# Patient Record
Sex: Female | Born: 1955 | Race: White | Hispanic: No | Marital: Married | Smoking: Former smoker
Health system: Southern US, Community
[De-identification: ages and names within clinical notes are randomized; demographics above are authoritative.]

## PROBLEM LIST (undated history)

## (undated) ENCOUNTER — Emergency Department (HOSPITAL_BASED_OUTPATIENT_CLINIC_OR_DEPARTMENT_OTHER): Admission: EM | Disposition: A | Discharge status: Other Acute Inpatient Hospital | Payer: BLUE CROSS/BLUE SHIELD

## (undated) DIAGNOSIS — Z923 Personal history of irradiation: Secondary | ICD-10-CM

## (undated) DIAGNOSIS — C801 Malignant (primary) neoplasm, unspecified: Secondary | ICD-10-CM

## (undated) DIAGNOSIS — I10 Essential (primary) hypertension: Secondary | ICD-10-CM

## (undated) DIAGNOSIS — I4891 Unspecified atrial fibrillation: Secondary | ICD-10-CM

## (undated) DIAGNOSIS — R000 Tachycardia, unspecified: Secondary | ICD-10-CM

## (undated) DIAGNOSIS — E039 Hypothyroidism, unspecified: Secondary | ICD-10-CM

## (undated) DIAGNOSIS — K635 Polyp of colon: Secondary | ICD-10-CM

## (undated) DIAGNOSIS — G43909 Migraine, unspecified, not intractable, without status migrainosus: Secondary | ICD-10-CM

## (undated) DIAGNOSIS — E079 Disorder of thyroid, unspecified: Secondary | ICD-10-CM

## (undated) DIAGNOSIS — F419 Anxiety disorder, unspecified: Secondary | ICD-10-CM

## (undated) HISTORY — DX: Anxiety disorder, unspecified: F41.9

## (undated) HISTORY — PX: BREAST BIOPSY: SHX20

## (undated) HISTORY — PX: HERNIA REPAIR: SHX51

## (undated) HISTORY — DX: Malignant (primary) neoplasm, unspecified: C80.1

## (undated) HISTORY — DX: Unspecified atrial fibrillation: I48.91

## (undated) HISTORY — DX: Disorder of thyroid, unspecified: E07.9

## (undated) HISTORY — DX: Migraine, unspecified, not intractable, without status migrainosus: G43.909

## (undated) HISTORY — PX: WISDOM TOOTH EXTRACTION: SHX21

## (undated) HISTORY — DX: Polyp of colon: K63.5

## (undated) HISTORY — DX: Tachycardia, unspecified: R00.0

---
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
## 1999-08-09 ENCOUNTER — Encounter: Admission: RE | Admit: 1999-08-09 | Discharge: 1999-08-09 | Payer: Self-pay | Admitting: Family Medicine

## 1999-08-09 ENCOUNTER — Encounter: Payer: Self-pay | Admitting: Family Medicine

## 2000-10-12 ENCOUNTER — Encounter: Payer: Self-pay | Admitting: Family Medicine

## 2000-10-12 ENCOUNTER — Encounter: Admission: RE | Admit: 2000-10-12 | Discharge: 2000-10-12 | Payer: Self-pay | Admitting: Family Medicine

## 2000-10-27 ENCOUNTER — Other Ambulatory Visit: Admission: RE | Admit: 2000-10-27 | Discharge: 2000-10-27 | Payer: Self-pay | Admitting: Obstetrics and Gynecology

## 2001-10-29 ENCOUNTER — Encounter: Admission: RE | Admit: 2001-10-29 | Discharge: 2001-10-29 | Payer: Self-pay | Admitting: Family Medicine

## 2001-10-29 ENCOUNTER — Encounter: Payer: Self-pay | Admitting: Family Medicine

## 2002-01-06 ENCOUNTER — Other Ambulatory Visit: Admission: RE | Admit: 2002-01-06 | Discharge: 2002-01-06 | Payer: Self-pay | Admitting: Obstetrics and Gynecology

## 2002-11-21 ENCOUNTER — Encounter: Admission: RE | Admit: 2002-11-21 | Discharge: 2002-11-21 | Payer: Self-pay | Admitting: Family Medicine

## 2002-11-21 ENCOUNTER — Encounter: Payer: Self-pay | Admitting: Family Medicine

## 2003-01-17 ENCOUNTER — Other Ambulatory Visit: Admission: RE | Admit: 2003-01-17 | Discharge: 2003-01-17 | Payer: Self-pay | Admitting: Obstetrics and Gynecology

## 2004-01-18 ENCOUNTER — Encounter: Admission: RE | Admit: 2004-01-18 | Discharge: 2004-01-18 | Payer: Self-pay | Admitting: Family Medicine

## 2004-02-27 ENCOUNTER — Other Ambulatory Visit: Admission: RE | Admit: 2004-02-27 | Discharge: 2004-02-27 | Payer: Self-pay | Admitting: Obstetrics and Gynecology

## 2005-03-03 ENCOUNTER — Encounter: Admission: RE | Admit: 2005-03-03 | Discharge: 2005-03-03 | Payer: Self-pay | Admitting: Family Medicine

## 2005-04-11 ENCOUNTER — Other Ambulatory Visit: Admission: RE | Admit: 2005-04-11 | Discharge: 2005-04-11 | Payer: Self-pay | Admitting: Obstetrics and Gynecology

## 2006-05-26 ENCOUNTER — Encounter: Admission: RE | Admit: 2006-05-26 | Discharge: 2006-05-26 | Payer: Self-pay | Admitting: Family Medicine

## 2006-10-13 ENCOUNTER — Other Ambulatory Visit: Admission: RE | Admit: 2006-10-13 | Discharge: 2006-10-13 | Payer: Self-pay | Admitting: Obstetrics & Gynecology

## 2006-10-26 ENCOUNTER — Encounter: Admission: RE | Admit: 2006-10-26 | Discharge: 2006-10-26 | Payer: Self-pay | Admitting: Obstetrics & Gynecology

## 2007-06-01 ENCOUNTER — Encounter: Admission: RE | Admit: 2007-06-01 | Discharge: 2007-06-01 | Payer: Self-pay | Admitting: Family Medicine

## 2007-12-30 ENCOUNTER — Other Ambulatory Visit: Admission: RE | Admit: 2007-12-30 | Discharge: 2007-12-30 | Payer: Self-pay | Admitting: Obstetrics and Gynecology

## 2008-07-03 ENCOUNTER — Encounter: Admission: RE | Admit: 2008-07-03 | Discharge: 2008-07-03 | Payer: Self-pay | Admitting: Family Medicine

## 2009-01-02 ENCOUNTER — Other Ambulatory Visit: Admission: RE | Admit: 2009-01-02 | Discharge: 2009-01-02 | Payer: Self-pay | Admitting: Obstetrics and Gynecology

## 2009-08-06 ENCOUNTER — Encounter: Admission: RE | Admit: 2009-08-06 | Discharge: 2009-08-06 | Payer: Self-pay | Admitting: Family Medicine

## 2010-05-30 DIAGNOSIS — R000 Tachycardia, unspecified: Secondary | ICD-10-CM

## 2010-05-30 DIAGNOSIS — E079 Disorder of thyroid, unspecified: Secondary | ICD-10-CM

## 2010-05-30 HISTORY — DX: Tachycardia, unspecified: R00.0

## 2010-05-30 HISTORY — DX: Disorder of thyroid, unspecified: E07.9

## 2010-06-02 DIAGNOSIS — I4891 Unspecified atrial fibrillation: Secondary | ICD-10-CM

## 2010-06-02 HISTORY — DX: Unspecified atrial fibrillation: I48.91

## 2010-10-21 ENCOUNTER — Encounter: Payer: Self-pay | Admitting: Family Medicine

## 2010-10-29 ENCOUNTER — Encounter
Admission: RE | Admit: 2010-10-29 | Discharge: 2010-10-29 | Payer: Self-pay | Source: Home / Self Care | Attending: Family Medicine | Admitting: Family Medicine

## 2011-11-07 ENCOUNTER — Other Ambulatory Visit: Payer: Self-pay | Admitting: Family Medicine

## 2011-11-07 DIAGNOSIS — Z1231 Encounter for screening mammogram for malignant neoplasm of breast: Secondary | ICD-10-CM

## 2011-12-02 ENCOUNTER — Ambulatory Visit
Admission: RE | Admit: 2011-12-02 | Discharge: 2011-12-02 | Disposition: A | Discharge status: Home / Self Care | Payer: 59 | Source: Ambulatory Visit | Attending: Family Medicine | Admitting: Family Medicine

## 2011-12-02 DIAGNOSIS — Z1231 Encounter for screening mammogram for malignant neoplasm of breast: Secondary | ICD-10-CM

## 2012-01-12 LAB — BASIC METABOLIC PANEL
Creatinine: 0.7 mg/dL (ref <=1.1)
Sodium: 141 mmol/L (ref 137–147)

## 2012-01-12 LAB — LIPID PANEL
Cholesterol: 205 mg/dL — AB (ref 0–200)
HDL: 54 mg/dL (ref 35–70)
LDL Cholesterol: 134 mg/dL
Triglycerides: 84 mg/dL (ref 40–160)

## 2012-01-12 LAB — CBC AND DIFFERENTIAL: WBC: 5.8 10^3/mL

## 2012-01-12 LAB — HEPATIC FUNCTION PANEL: AST: 17 U/L (ref 13–35)

## 2012-11-02 ENCOUNTER — Other Ambulatory Visit: Payer: Self-pay | Admitting: Family Medicine

## 2012-11-02 DIAGNOSIS — Z1231 Encounter for screening mammogram for malignant neoplasm of breast: Secondary | ICD-10-CM

## 2012-12-02 ENCOUNTER — Ambulatory Visit (HOSPITAL_BASED_OUTPATIENT_CLINIC_OR_DEPARTMENT_OTHER): Payer: 59

## 2013-01-11 ENCOUNTER — Inpatient Hospital Stay (HOSPITAL_BASED_OUTPATIENT_CLINIC_OR_DEPARTMENT_OTHER): Admission: RE | Admit: 2013-01-11 | Payer: 59 | Source: Ambulatory Visit

## 2013-01-12 ENCOUNTER — Ambulatory Visit (HOSPITAL_BASED_OUTPATIENT_CLINIC_OR_DEPARTMENT_OTHER)
Admission: RE | Admit: 2013-01-12 | Discharge: 2013-01-12 | Disposition: A | Discharge status: Home / Self Care | Payer: 59 | Source: Ambulatory Visit | Attending: Family Medicine | Admitting: Family Medicine

## 2013-01-12 DIAGNOSIS — Z1231 Encounter for screening mammogram for malignant neoplasm of breast: Secondary | ICD-10-CM | POA: Insufficient documentation

## 2013-01-21 ENCOUNTER — Encounter: Payer: Self-pay | Admitting: Nurse Practitioner

## 2013-01-24 ENCOUNTER — Ambulatory Visit: Payer: Self-pay | Admitting: Nurse Practitioner

## 2013-03-08 ENCOUNTER — Ambulatory Visit: Payer: Self-pay | Admitting: Nurse Practitioner

## 2013-05-05 ENCOUNTER — Ambulatory Visit: Payer: Self-pay | Admitting: Nurse Practitioner

## 2013-07-05 ENCOUNTER — Encounter: Payer: Self-pay | Admitting: Nurse Practitioner

## 2013-07-05 ENCOUNTER — Ambulatory Visit (INDEPENDENT_AMBULATORY_CARE_PROVIDER_SITE_OTHER): Payer: BC Managed Care – PPO | Admitting: Nurse Practitioner

## 2013-07-05 VITALS — BP 128/80 | HR 68 | Resp 16 | Ht 60.0 in | Wt 217.0 lb

## 2013-07-05 DIAGNOSIS — E559 Vitamin D deficiency, unspecified: Secondary | ICD-10-CM

## 2013-07-05 DIAGNOSIS — E039 Hypothyroidism, unspecified: Secondary | ICD-10-CM

## 2013-07-05 DIAGNOSIS — E038 Other specified hypothyroidism: Secondary | ICD-10-CM | POA: Insufficient documentation

## 2013-07-05 DIAGNOSIS — Z01419 Encounter for gynecological examination (general) (routine) without abnormal findings: Secondary | ICD-10-CM

## 2013-07-05 DIAGNOSIS — F411 Generalized anxiety disorder: Secondary | ICD-10-CM

## 2013-07-05 DIAGNOSIS — F419 Anxiety disorder, unspecified: Secondary | ICD-10-CM

## 2013-07-05 NOTE — Patient Instructions (Addendum)

## 2013-07-05 NOTE — Progress Notes (Signed)
57 y.o. Married Caucasian female  G1P0010 here for annual exam.   Saw cardiologist in May and is on Toprol 50 mg  twice daily.   Recent TSH on 05/02/13 was 5.960.  PCP since then has adjusted her dose of Synthroid.  She had shingles in 12/13 right posterior back.   Patient's last menstrual period was 02/27/2010.          Sexually active: yes  The current method of family planning is post menopausal status.    Exercising: no  The patient does not participate in regular exercise at present.  Last mammogram: 11/2012 normal Last pap: 01/16/12 neg HR HPV Last BMD: never Alcohol: no Tobacco: quit 3 years ago 06/02/10  Hgb PCP        Urine   PCP Colonoscopy 10/2012 polyps, repeat in 5 years  Health Maintenance  Topic Date Due  . Tetanus/tdap  10/18/1974  . Colonoscopy  10/18/2005  . Pap Smear  01/15/2013  . Influenza Vaccine  04/29/2013  . Mammogram  01/13/2014    Family History  Problem Relation Age of Onset  . Breast cancer Sister   . Thyroid disease Sister   . Heart failure Sister     rheumatic fever  . Rheumatic fever Sister   . Diabetes Mother   . Liver disease Mother   . Cirrhosis Mother     Patient Active Problem List   Diagnosis Date Noted  . Chronic anxiety 07/05/2013  . Unspecified hypothyroidism 07/05/2013  . Severe obesity (BMI >= 40) 07/05/2013  . Unspecified vitamin D deficiency 07/05/2013    Past Medical History  Diagnosis Date  . Anxiety     chronic, panic attacks  . Smoker     quit 06/02/10  . Migraine   . Colon polyp     tubular adenoma  . Tachycardia 05/2010  . Thyroid disease 9/ 2011    hypothyroid  . Atrial fibrillation 06/02/2010    treated with Cardiazem and Lovenox at [facility], now ASA    Past Surgical History  Procedure Laterality Date  . Appendectomy  1967  . Wisdom tooth extraction      Allergies: Penicillins and Adhesive  Current Outpatient Prescriptions  Medication Sig Dispense Refill  . ALPRAZolam (XANAX) 0.25 MG tablet Take 0.25  mg by mouth as needed for sleep.      Marland Kitchen aspirin EC 81 MG tablet Take 81 mg by mouth daily.      . Cholecalciferol (VITAMIN D3) 2000 UNITS TABS Take by mouth daily.      . clindamycin (CLINDAGEL) 1 % gel Apply topically as needed.      Marland Kitchen esomeprazole (NEXIUM) 40 MG capsule Take 40 mg by mouth as needed.       Marland Kitchen ibuprofen (ADVIL,MOTRIN) 200 MG tablet Take 200 mg by mouth every 6 (six) hours as needed for pain.      Marland Kitchen levothyroxine (SYNTHROID, LEVOTHROID) 137 MCG tablet Take 137 mcg by mouth daily before breakfast. Currently taking 1/2 of daily (. )      . metoprolol succinate (TOPROL-XL) 25 MG 24 hr tablet Take 50 mg by mouth 2 (two) times daily.       . Multiple Vitamin (MULTIVITAMIN) tablet Take 1 tablet by mouth daily.      . Omega-3 Fatty Acids (FISH OIL PO) Take by mouth daily.      Marland Kitchen trolamine salicylate (MYOFLEX) 10 % cream Apply topically as needed.      . ValACYclovir HCl (VALTREX PO) Take  by mouth as needed.        No current facility-administered medications for this visit.    ROS: Pertinent items are noted in HPI.  Exam:    BP 128/80  Pulse 68  Resp 16  Ht 5' (1.524 m)  Wt 217 lb (98.431 kg)  BMI 42.38 kg/m2  LMP 02/27/2010 Weight change: @WEIGHT  CHANGE@ Last 3 height recordings:  Ht Readings from Last 3 Encounters:  07/05/13 5' (1.524 m)   General appearance: alert and cooperative Head: Normocephalic, without obvious abnormality, atraumatic Neck: no adenopathy, no carotid bruit, no JVD, supple, symmetrical, trachea midline and thyroid not enlarged, symmetric, no tenderness/mass/nodules Lungs: clear to auscultation bilaterally Breasts: normal appearance, no masses or tenderness Heart: regular rate and rhythm Abdomen: soft, non-tender; bowel sounds normal; no masses,  no organomegaly Extremities: extremities normal, atraumatic, no cyanosis or edema Skin: Skin color, texture, turgor normal. No rashes or lesions Lymph nodes: Cervical, supraclavicular, and  axillary nodes normal. no inguinal nodes palpated Neurologic: Grossly normal   Pelvic: External genitalia:  no lesions              Urethra: normal appearing urethra with no masses, tenderness or lesions              Bartholin's and Skene's: Bartholin's, Urethra, Skene's normal                 Vagina: normal appearing vagina with normal color and discharge, no lesions              Cervix: normal appearance              Pap taken: no        Bimanual Exam:  Uterus:  uterus is normal size, shape, consistency and non tender                                      Adnexa:    normal adnexa in size, non tender and no masses                                      Rectovaginal: Confirms                                      Anus:  normal sphincter tone, no lesions  A:         Well Woman AEX  Postmenopausal no HRT  History of A Fib and tachycardia  Hypothyroid  Vit D deficiency     P: Pap as per guidelines     Mammogram is due 3/15  Reviewed patient labs  Counseled on breast self exam, adequate intake of calcium and vitamin D, diet and exercise return annually or prn      An After Visit Summary was printed and given to the patient.

## 2013-07-10 NOTE — Progress Notes (Signed)
Encounter reviewed by Dr. Yorel Redder Silva.  

## 2014-03-03 ENCOUNTER — Other Ambulatory Visit: Payer: Self-pay

## 2014-03-07 ENCOUNTER — Other Ambulatory Visit (HOSPITAL_BASED_OUTPATIENT_CLINIC_OR_DEPARTMENT_OTHER): Payer: Self-pay | Admitting: Family Medicine

## 2014-03-07 DIAGNOSIS — Z1231 Encounter for screening mammogram for malignant neoplasm of breast: Secondary | ICD-10-CM

## 2014-04-05 ENCOUNTER — Ambulatory Visit (HOSPITAL_BASED_OUTPATIENT_CLINIC_OR_DEPARTMENT_OTHER)
Admission: RE | Admit: 2014-04-05 | Discharge: 2014-04-05 | Disposition: A | Discharge status: Home / Self Care | Payer: BC Managed Care – PPO | Source: Ambulatory Visit | Attending: Family Medicine | Admitting: Family Medicine

## 2014-04-05 ENCOUNTER — Ambulatory Visit (HOSPITAL_BASED_OUTPATIENT_CLINIC_OR_DEPARTMENT_OTHER): Payer: 59

## 2014-04-05 DIAGNOSIS — Z1231 Encounter for screening mammogram for malignant neoplasm of breast: Secondary | ICD-10-CM | POA: Insufficient documentation

## 2014-05-16 DIAGNOSIS — Z0000 Encounter for general adult medical examination without abnormal findings: Secondary | ICD-10-CM | POA: Insufficient documentation

## 2014-05-16 DIAGNOSIS — I10 Essential (primary) hypertension: Secondary | ICD-10-CM | POA: Insufficient documentation

## 2014-05-16 DIAGNOSIS — I493 Ventricular premature depolarization: Secondary | ICD-10-CM | POA: Insufficient documentation

## 2014-05-16 DIAGNOSIS — I4891 Unspecified atrial fibrillation: Secondary | ICD-10-CM | POA: Insufficient documentation

## 2014-07-11 ENCOUNTER — Ambulatory Visit: Payer: BC Managed Care – PPO | Admitting: Nurse Practitioner

## 2014-07-31 ENCOUNTER — Encounter: Payer: Self-pay | Admitting: Nurse Practitioner

## 2014-08-07 ENCOUNTER — Ambulatory Visit: Payer: BC Managed Care – PPO | Admitting: Nurse Practitioner

## 2014-10-11 ENCOUNTER — Ambulatory Visit: Payer: Self-pay | Admitting: Nurse Practitioner

## 2014-12-20 DIAGNOSIS — E785 Hyperlipidemia, unspecified: Secondary | ICD-10-CM | POA: Insufficient documentation

## 2015-01-02 ENCOUNTER — Ambulatory Visit (INDEPENDENT_AMBULATORY_CARE_PROVIDER_SITE_OTHER): Payer: BLUE CROSS/BLUE SHIELD | Admitting: Nurse Practitioner

## 2015-01-02 ENCOUNTER — Encounter: Payer: Self-pay | Admitting: Nurse Practitioner

## 2015-01-02 VITALS — BP 126/74 | HR 72 | Ht 60.0 in | Wt 220.0 lb

## 2015-01-02 DIAGNOSIS — F419 Anxiety disorder, unspecified: Secondary | ICD-10-CM

## 2015-01-02 DIAGNOSIS — I4891 Unspecified atrial fibrillation: Secondary | ICD-10-CM | POA: Diagnosis not present

## 2015-01-02 DIAGNOSIS — Z0000 Encounter for general adult medical examination without abnormal findings: Secondary | ICD-10-CM | POA: Diagnosis not present

## 2015-01-02 DIAGNOSIS — Z01419 Encounter for gynecological examination (general) (routine) without abnormal findings: Secondary | ICD-10-CM

## 2015-01-02 NOTE — Progress Notes (Signed)
Patient ID: Theresa Duncan, female   DOB: [birth year], 59 y.o.   MRN: [id number] 60 y.o. G43P0010 Married  Caucasian Fe here for annual exam.  No new diagnosis.  Less A-Fib episodes with less flutter this year since a change in med's and adding Tambor.    Patient's last menstrual period was 02/27/2010.          Sexually active: Yes.    The current method of family planning is condoms sometimes and post menopausal status.    Exercising:  Pt does not exercise on regular basis.  occasional resistance program. Smoker:  Quit 06/02/10  Health Maintenance: Pap:  01/16/12, negative with neg HR HPV MMG:  04/05/14, Bi-Rads 1:  Negative Colonoscopy:  10/2012, polyps, repeat in 5 years BMD:   Never  TDaP:  Within last 5-6 years Labs:  PCP, brought copy for chart   reports that she quit smoking about 4 years ago. She has never used smokeless tobacco. She reports that she does not drink alcohol or use illicit drugs.  Past Medical History  Diagnosis Date  . Anxiety     chronic, panic attacks  . Smoker     quit 06/02/10  . Migraine   . Colon polyp     tubular adenoma  . Tachycardia 05/2010  . Thyroid disease 9/ 2011    hypothyroid  . Atrial fibrillation 06/02/2010    treated with Cardiazem and Lovenox at [facility], now ASA    Past Surgical History  Procedure Laterality Date  . Appendectomy  1967  . Wisdom tooth extraction      Current Outpatient Prescriptions  Medication Sig Dispense Refill  . ALPRAZolam (XANAX) 0.25 MG tablet Take 0.25 mg by mouth as needed for sleep.    Marland Kitchen aspirin EC 81 MG tablet Take 81 mg by mouth daily.    . Cholecalciferol (VITAMIN D3) 2000 UNITS TABS Take by mouth daily.    . clindamycin (CLINDAGEL) 1 % gel Apply topically as needed.    . diltiazem (CARDIZEM CD) 120 MG 24 hr capsule Take 120 mg by mouth daily.  3  . esomeprazole (NEXIUM) 40 MG capsule Take 40 mg by mouth as needed.     . flecainide (TAMBOCOR) 50 MG tablet Take 1 tablet by mouth 2 (two) times daily.  6  .  ibuprofen (ADVIL,MOTRIN) 200 MG tablet Take 200 mg by mouth every 6 (six) hours as needed for pain.    Marland Kitchen levothyroxine (SYNTHROID, LEVOTHROID) 88 MCG tablet Take 1 tablet by mouth daily. Take extra 1/2 tab once weekly  4  . meclizine (ANTIVERT) 25 MG tablet Take 25 mg by mouth 3 (three) times daily as needed.    . Multiple Vitamin (MULTIVITAMIN) tablet Take 1 tablet by mouth daily.    Marland Kitchen trolamine salicylate (MYOFLEX) 10 % cream Apply topically as needed.    . ValACYclovir HCl (VALTREX PO) Take by mouth as needed.     . meloxicam (MOBIC) 7.5 MG tablet Take 7.5 mg by mouth daily.     No current facility-administered medications for this visit.    Family History  Problem Relation Age of Onset  . Breast cancer Sister   . Thyroid disease Sister   . Heart failure Sister     rheumatic fever  . Rheumatic fever Sister   . Diabetes Mother   . Liver disease Mother   . Cirrhosis Mother     ROS:  Pertinent items are noted in HPI.  Otherwise, a comprehensive ROS was negative.  Exam:   BP 126/74 mmHg  Pulse 72  Ht 5' (1.524 m)  Wt 220 lb (99.791 kg)  BMI 42.97 kg/m2  LMP 02/27/2010 Height: 5' (152.4 cm) Ht Readings from Last 3 Encounters:  01/02/15 5' (1.524 m)  07/05/13 5' (1.524 m)    General appearance: alert, cooperative and appears stated age Head: Normocephalic, without obvious abnormality, atraumatic Neck: no adenopathy, supple, symmetrical, trachea midline and thyroid normal to inspection and palpation Lungs: clear to auscultation bilaterally Breasts: normal appearance, no masses or tenderness Heart: regular rate and rhythm Abdomen: soft, non-tender; no masses,  no organomegaly Extremities: extremities normal, atraumatic, no cyanosis or edema Skin: Skin color, texture, turgor normal. No rashes or lesions Lymph nodes: Cervical, supraclavicular, and axillary nodes normal. No abnormal inguinal nodes palpated Neurologic: Grossly normal   Pelvic: External genitalia:  no  lesions              Urethra:  normal appearing urethra with no masses, tenderness or lesions              Bartholin's and Skene's: normal                 Vagina: normal appearing vagina with normal color and discharge, no lesions              Cervix: anteverted              Pap taken: Yes.   Bimanual Exam:  Uterus:  normal size, contour, position, consistency, mobility, non-tender              Adnexa: no mass, fullness, tenderness               Rectovaginal: Confirms               Anus:  normal sphincter tone, no lesions  Chaperone present: yes  A:  Well Woman with normal exam  Postmenopausal no HRT History of A Fib and tachycardia - better with adding a new med Hypothyroid with wt. gain Vit D deficiency    P:   Reviewed health and wellness pertinent to exam  Pap smear taken today  Mammogram is due 7/16  Follow with pap  Copy of labs from PCP to be scanned  Counseled on breast self exam, mammography screening, adequate intake of calcium and vitamin D, diet and exercise, Kegel's exercises return annually or prn  An After Visit Summary was printed and given to the patient.

## 2015-01-02 NOTE — Patient Instructions (Addendum)

## 2015-01-05 LAB — IPS PAP TEST WITH HPV

## 2015-01-07 NOTE — Progress Notes (Signed)
Encounter reviewed by Dr. Payslee Bateson Silva.  

## 2015-06-05 ENCOUNTER — Other Ambulatory Visit (HOSPITAL_BASED_OUTPATIENT_CLINIC_OR_DEPARTMENT_OTHER): Payer: Self-pay | Admitting: Family Medicine

## 2015-06-05 DIAGNOSIS — Z1231 Encounter for screening mammogram for malignant neoplasm of breast: Secondary | ICD-10-CM

## 2015-07-13 ENCOUNTER — Ambulatory Visit (HOSPITAL_BASED_OUTPATIENT_CLINIC_OR_DEPARTMENT_OTHER): Payer: Self-pay

## 2015-07-20 ENCOUNTER — Ambulatory Visit (HOSPITAL_BASED_OUTPATIENT_CLINIC_OR_DEPARTMENT_OTHER)
Admission: RE | Admit: 2015-07-20 | Discharge: 2015-07-20 | Disposition: A | Discharge status: Home / Self Care | Payer: BLUE CROSS/BLUE SHIELD | Source: Ambulatory Visit | Attending: Family Medicine | Admitting: Family Medicine

## 2015-07-20 DIAGNOSIS — Z1231 Encounter for screening mammogram for malignant neoplasm of breast: Secondary | ICD-10-CM | POA: Insufficient documentation

## 2016-01-14 ENCOUNTER — Ambulatory Visit: Payer: BLUE CROSS/BLUE SHIELD | Admitting: Nurse Practitioner

## 2016-01-29 ENCOUNTER — Encounter: Payer: Self-pay | Admitting: Nurse Practitioner

## 2016-01-29 ENCOUNTER — Ambulatory Visit (INDEPENDENT_AMBULATORY_CARE_PROVIDER_SITE_OTHER): Payer: BLUE CROSS/BLUE SHIELD | Admitting: Nurse Practitioner

## 2016-01-29 VITALS — BP 134/76 | HR 64 | Ht 59.75 in | Wt 211.0 lb

## 2016-01-29 DIAGNOSIS — Z01419 Encounter for gynecological examination (general) (routine) without abnormal findings: Secondary | ICD-10-CM

## 2016-01-29 DIAGNOSIS — Z0000 Encounter for general adult medical examination without abnormal findings: Secondary | ICD-10-CM

## 2016-01-29 DIAGNOSIS — I4891 Unspecified atrial fibrillation: Secondary | ICD-10-CM

## 2016-01-29 DIAGNOSIS — F419 Anxiety disorder, unspecified: Secondary | ICD-10-CM

## 2016-01-29 NOTE — Progress Notes (Signed)
Patient ID: Theresa Duncan, female   DOB: 02-27-1956, 60 y.o.   MRN: [id number]  60 y.o. G47P0010 Married  Caucasian Fe here for annual exam.  Now off cholesterol med's.  Sees cardiologist and doing well.  Patient's last menstrual period was 02/27/2010.          Sexually active: Yes.    The current method of family planning is condoms and post menopausal status.    Exercising: Yes.    walking and some strength training Smoker:  no  Health Maintenance: 01/03/15, Negative with neg HR HPV MMG: 07/20/15, Bi-Rads 1: Negative Colonoscopy: 10/2012, polyps, repeat in 5 years ([facility]) TDaP: UTD Hep C and HIV: declined today Labs: PCP and work, will fax copy for our records   reports that she quit smoking about 5 years ago. She has never used smokeless tobacco. She reports that she does not drink alcohol or use illicit drugs.  Past Medical History  Diagnosis Date  . Anxiety     chronic, panic attacks  . Smoker     quit 06/02/10  . Migraine   . Colon polyp     tubular adenoma  . Tachycardia 05/2010  . Thyroid disease 9/ 2011    hypothyroid  . Atrial fibrillation ([place]) 06/02/2010    treated with Cardiazem and Lovenox at [facility], now ASA    Past Surgical History  Procedure Laterality Date  . Appendectomy  1967  . Wisdom tooth extraction      Current Outpatient Prescriptions  Medication Sig Dispense Refill  . ALPRAZolam (XANAX) 0.25 MG tablet Take 0.25 mg by mouth as needed for sleep.    Marland Kitchen aspirin EC 81 MG tablet Take 81 mg by mouth daily.    . Cholecalciferol (VITAMIN D3) 2000 UNITS TABS Take by mouth daily.    . clindamycin (CLINDAGEL) 1 % gel Apply topically as needed.    . diltiazem (CARDIZEM CD) 120 MG 24 hr capsule Take 120 mg by mouth daily.  3  . esomeprazole (NEXIUM) 40 MG capsule Take 40 mg by mouth as needed.     . flecainide (TAMBOCOR) 50 MG tablet Take 1 tablet by mouth 2 (two) times daily.  6  . ibuprofen (ADVIL,MOTRIN) 200 MG tablet Take 200 mg by mouth every 6 (six)  hours as needed for pain.    Marland Kitchen levothyroxine (SYNTHROID, LEVOTHROID) 88 MCG tablet Take 1 tablet by mouth daily. Take extra 1/2 tab once weekly  4  . meclizine (ANTIVERT) 25 MG tablet Take 25 mg by mouth 3 (three) times daily as needed.    . meloxicam (MOBIC) 7.5 MG tablet Take 7.5 mg by mouth daily.    . Multiple Vitamin (MULTIVITAMIN) tablet Take 1 tablet by mouth daily.    Marland Kitchen trolamine salicylate (MYOFLEX) 10 % cream Apply topically as needed.     No current facility-administered medications for this visit.    Family History  Problem Relation Age of Onset  . Breast cancer Sister   . Thyroid disease Sister   . Heart failure Sister     rheumatic fever  . Rheumatic fever Sister   . Diabetes Mother   . Liver disease Mother   . Cirrhosis Mother     ROS:  Pertinent items are noted in HPI.  Otherwise, a comprehensive ROS was negative.  Exam:   BP 134/76 mmHg  Pulse 64  Ht 4' 11.75" (1.518 m)  Wt 211 lb (95.709 kg)  BMI 41.53 kg/m2  LMP 02/27/2010 Height: 4'  11.75" (151.8 cm) Ht Readings from Last 3 Encounters:  01/29/16 4' 11.75" (1.518 m)  01/02/15 5' (1.524 m)  07/05/13 5' (1.524 m)    General appearance: alert, cooperative and appears stated age Head: Normocephalic, without obvious abnormality, atraumatic Neck: no adenopathy, supple, symmetrical, trachea midline and thyroid normal to inspection and palpation Lungs: clear to auscultation bilaterally Breasts: normal appearance, no masses or tenderness Heart: regular rate and rhythm Abdomen: soft, non-tender; no masses,  no organomegaly Extremities: extremities normal, atraumatic, no cyanosis or edema Skin: Skin color, texture, turgor normal. No rashes or lesions Lymph nodes: Cervical, supraclavicular, and axillary nodes normal. No abnormal inguinal nodes palpated Neurologic: Grossly normal   Pelvic: External genitalia:  no lesions              Urethra:  normal appearing urethra with no masses, tenderness or lesions               Bartholin's and Skene's: normal                 Vagina: normal appearing vagina with normal color and discharge, no lesions              Cervix: anteverted              Pap taken: No. Bimanual Exam:  Uterus:  normal size, contour, position, consistency, mobility, non-tender              Adnexa: no mass, fullness, tenderness               Rectovaginal: Confirms               Anus:  normal sphincter tone, no lesions  Chaperone present: no  A:  Well Woman with normal exam  Postmenopausal no HRT History of A Fib and tachycardia - better with adding Flecainide Hypothyroid with wt. gain Vit D deficiency   P:   Reviewed health and wellness pertinent to exam  Pap smear as above  Mammogram is due 06/2016  Counseled on breast self exam, mammography screening, adequate intake of calcium and vitamin D, diet and exercise, Kegel's exercises return annually or prn  An After Visit Summary was printed and given to the patient.

## 2016-01-29 NOTE — Patient Instructions (Signed)

## 2016-02-03 NOTE — Progress Notes (Signed)
Encounter reviewed by Dr. Akaylah Lalley Amundson C. Silva.  

## 2016-07-01 ENCOUNTER — Other Ambulatory Visit (HOSPITAL_BASED_OUTPATIENT_CLINIC_OR_DEPARTMENT_OTHER): Payer: Self-pay | Admitting: Family Medicine

## 2016-07-01 DIAGNOSIS — Z1231 Encounter for screening mammogram for malignant neoplasm of breast: Secondary | ICD-10-CM

## 2016-07-15 DIAGNOSIS — L659 Nonscarring hair loss, unspecified: Secondary | ICD-10-CM | POA: Insufficient documentation

## 2016-07-15 DIAGNOSIS — L821 Other seborrheic keratosis: Secondary | ICD-10-CM | POA: Insufficient documentation

## 2016-07-28 ENCOUNTER — Ambulatory Visit (HOSPITAL_BASED_OUTPATIENT_CLINIC_OR_DEPARTMENT_OTHER): Payer: BLUE CROSS/BLUE SHIELD

## 2016-08-04 ENCOUNTER — Ambulatory Visit (HOSPITAL_BASED_OUTPATIENT_CLINIC_OR_DEPARTMENT_OTHER)
Admission: RE | Admit: 2016-08-04 | Discharge: 2016-08-04 | Disposition: A | Discharge status: Home / Self Care | Payer: BLUE CROSS/BLUE SHIELD | Source: Ambulatory Visit | Attending: Family Medicine | Admitting: Family Medicine

## 2016-08-04 DIAGNOSIS — Z1231 Encounter for screening mammogram for malignant neoplasm of breast: Secondary | ICD-10-CM | POA: Insufficient documentation

## 2016-10-01 DIAGNOSIS — R002 Palpitations: Secondary | ICD-10-CM | POA: Insufficient documentation

## 2017-02-04 ENCOUNTER — Encounter: Payer: Self-pay | Admitting: Nurse Practitioner

## 2017-02-04 ENCOUNTER — Ambulatory Visit (INDEPENDENT_AMBULATORY_CARE_PROVIDER_SITE_OTHER): Payer: BLUE CROSS/BLUE SHIELD | Admitting: Nurse Practitioner

## 2017-02-04 VITALS — BP 122/72 | HR 68 | Resp 16 | Ht 59.75 in | Wt 197.0 lb

## 2017-02-04 DIAGNOSIS — E2839 Other primary ovarian failure: Secondary | ICD-10-CM

## 2017-02-04 DIAGNOSIS — Z01419 Encounter for gynecological examination (general) (routine) without abnormal findings: Secondary | ICD-10-CM | POA: Diagnosis not present

## 2017-02-04 DIAGNOSIS — N952 Postmenopausal atrophic vaginitis: Secondary | ICD-10-CM | POA: Diagnosis not present

## 2017-02-04 NOTE — Patient Instructions (Addendum)

## 2017-02-04 NOTE — Progress Notes (Signed)
61 y.o. G65P0010 Married  Caucasian Fe here for annual exam.  Pt is still followed by cardiologist every 6 months.  New adjustment on medication is working well to control heart rate.  No A Fib flares.  If in the future if not regulated cardiologist has told her that she may need considering ablation.  She is now back in [place]. Watchers since last August and has lost about 20 lb.    Patient's last menstrual period was 02/27/2010.          Sexually active: Yes.    The current method of family planning is post menopausal status.    Exercising: Yes.    walking Smoker:  no  Health Maintenance: Pap:  01/03/15 neg HPV HR neg History of Abnormal Pap: no MMG:  08-04-16 category c density birads 1:neg Self Breast exams: yes Colonoscopy:  10/2012 polyps fu/ 5 yrs ([facility] BMD:   none TDaP:  01/08/2011 Shingles: no Pneumonia: had 1 done years ago Hep C and HIV: not done - declines Labs: PCP   reports that she quit smoking about 6 years ago. She has never used smokeless tobacco. She reports that she does not drink alcohol or use drugs.  Past Medical History:  Diagnosis Date  . Anxiety    chronic, panic attacks  . Atrial fibrillation ([place]) 06/02/2010   treated with Cardiazem and Lovenox at [facility], now ASA  . Colon polyp    tubular adenoma  . Migraine   . Smoker    quit 06/02/10  . Tachycardia 05/2010  . Thyroid disease 9/ 2011   hypothyroid    Past Surgical History:  Procedure Laterality Date  . APPENDECTOMY  1967  . WISDOM TOOTH EXTRACTION      Current Outpatient Prescriptions  Medication Sig Dispense Refill  . ALPRAZolam (XANAX) 0.25 MG tablet TAKE 1/2 TO 1 TABLET TWICE A DAY AS NEEDED    . aspirin EC 81 MG tablet Take 81 mg by mouth daily.    . Cholecalciferol (VITAMIN D3) 2000 UNITS TABS Take by mouth daily.    . clindamycin (CLINDAGEL) 1 % gel Apply topically as needed.    . diltiazem (CARDIZEM CD) 120 MG 24 hr capsule Take 120 mg by mouth daily.  3  . flecainide (TAMBOCOR) 50  MG tablet Take 1 tablet by mouth. Take 75mg  twice daily  6  . ibuprofen (ADVIL,MOTRIN) 200 MG tablet Take 200 mg by mouth every 6 (six) hours as needed for pain.    Marland Kitchen levothyroxine (SYNTHROID, LEVOTHROID) 100 MCG tablet   0  . meclizine (ANTIVERT) 25 MG tablet Take 25 mg by mouth 3 (three) times daily as needed.    . meloxicam (MOBIC) 7.5 MG tablet Take 7.5 mg by mouth as needed.     . Multiple Vitamin (MULTIVITAMIN) tablet Take 1 tablet by mouth daily.    Marland Kitchen trolamine salicylate (MYOFLEX) 10 % cream Apply topically as needed.     No current facility-administered medications for this visit.     Family History  Problem Relation Age of Onset  . Breast cancer Sister   . Thyroid disease Sister   . Heart failure Sister     rheumatic fever  . Rheumatic fever Sister   . Diabetes Mother   . Liver disease Mother   . Cirrhosis Mother     ROS:  Pertinent items are noted in HPI.  Otherwise, a comprehensive ROS was negative.  Exam:   BP 122/72   Pulse 68  Resp 16   Ht 4' 11.75" (1.518 m)   Wt 197 lb (89.4 kg)   LMP 02/27/2010   BMI 38.80 kg/m  Height: 4' 11.75" (151.8 cm) Ht Readings from Last 3 Encounters:  02/04/17 4' 11.75" (1.518 m)  01/29/16 4' 11.75" (1.518 m)  01/02/15 5' (1.524 m)    General appearance: alert, cooperative and appears stated age Head: Normocephalic, without obvious abnormality, atraumatic Neck: no adenopathy, supple, symmetrical, trachea midline and thyroid normal to inspection and palpation Lungs: clear to auscultation bilaterally Breasts: normal appearance, no masses or tenderness Heart: regular rate and rhythm Abdomen: soft, non-tender; no masses,  no organomegaly Extremities: extremities normal, atraumatic, no cyanosis or edema Skin: Skin color, texture, turgor normal. No rashes or lesions Lymph nodes: Cervical, supraclavicular, and axillary nodes normal. No abnormal inguinal nodes palpated Neurologic: Grossly normal   Pelvic: External genitalia:   no lesions              Urethra:  normal appearing urethra with no masses, tenderness or lesions              Bartholin's and Skene's: normal                 Vagina: normal appearing vagina with normal color and discharge, no lesions              Cervix: anteverted              Pap taken: No. Bimanual Exam:  Uterus:  normal size, contour, position, consistency, mobility, non-tender              Adnexa: no mass, fullness, tenderness               Rectovaginal: Confirms               Anus:  normal sphincter tone, no lesions  Chaperone present: no  A:  Well Woman with normal exam  Postmenopausal no HRT History of A Fib and tachycardia - better with adding Flecainide Hypothyroid with wt. Gain - now back on Wt. Watchers Vit D deficiency    P:   Reviewed health and wellness pertinent to exam  Pap smear: no  Mammogram is due 07/2017 added BMD to be done in [facility] on breast self exam, mammography screening, adequate intake of calcium and vitamin D, diet and exercise, Kegel's exercises return annually or prn  An After Visit Summary was printed and given to the patient.

## 2017-02-05 NOTE — Progress Notes (Signed)
Encounter reviewed by Dr. Brook Amundson C. Silva.  

## 2017-08-25 ENCOUNTER — Other Ambulatory Visit (HOSPITAL_BASED_OUTPATIENT_CLINIC_OR_DEPARTMENT_OTHER): Payer: Self-pay | Admitting: Family Medicine

## 2017-08-25 DIAGNOSIS — Z1231 Encounter for screening mammogram for malignant neoplasm of breast: Secondary | ICD-10-CM

## 2017-09-15 ENCOUNTER — Ambulatory Visit (HOSPITAL_BASED_OUTPATIENT_CLINIC_OR_DEPARTMENT_OTHER)
Admission: RE | Admit: 2017-09-15 | Discharge: 2017-09-15 | Disposition: A | Discharge status: Home / Self Care | Payer: BLUE CROSS/BLUE SHIELD | Source: Ambulatory Visit | Attending: Family Medicine | Admitting: Family Medicine

## 2017-09-15 DIAGNOSIS — Z1231 Encounter for screening mammogram for malignant neoplasm of breast: Secondary | ICD-10-CM | POA: Diagnosis not present

## 2017-09-16 ENCOUNTER — Other Ambulatory Visit: Payer: Self-pay | Admitting: Family Medicine

## 2017-09-16 DIAGNOSIS — R928 Other abnormal and inconclusive findings on diagnostic imaging of breast: Secondary | ICD-10-CM

## 2017-09-24 ENCOUNTER — Other Ambulatory Visit: Payer: Self-pay | Admitting: Family Medicine

## 2017-09-24 ENCOUNTER — Ambulatory Visit
Admission: RE | Admit: 2017-09-24 | Discharge: 2017-09-24 | Disposition: A | Discharge status: Home / Self Care | Payer: BLUE CROSS/BLUE SHIELD | Source: Ambulatory Visit | Attending: Family Medicine | Admitting: Family Medicine

## 2017-09-24 DIAGNOSIS — R928 Other abnormal and inconclusive findings on diagnostic imaging of breast: Secondary | ICD-10-CM

## 2017-10-02 ENCOUNTER — Other Ambulatory Visit: Payer: Self-pay | Admitting: Family Medicine

## 2017-10-02 ENCOUNTER — Ambulatory Visit
Admission: RE | Admit: 2017-10-02 | Discharge: 2017-10-02 | Disposition: A | Discharge status: Home / Self Care | Payer: BLUE CROSS/BLUE SHIELD | Source: Ambulatory Visit | Attending: Family Medicine | Admitting: Family Medicine

## 2017-10-02 DIAGNOSIS — R928 Other abnormal and inconclusive findings on diagnostic imaging of breast: Secondary | ICD-10-CM

## 2017-10-06 ENCOUNTER — Telehealth: Payer: Self-pay | Admitting: Hematology

## 2017-10-06 ENCOUNTER — Encounter: Payer: Self-pay | Admitting: *Deleted

## 2017-10-06 NOTE — Progress Notes (Signed)
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 Informed she will receive an appt from [facility] for surgical consult. Received verbal understanding. Denies further needs at this time

## 2017-10-06 NOTE — Telephone Encounter (Signed)
Confirmed afternoon [facility] appointment with patient for 10/14/17, packet sent to patient thru e-mail per patients request

## 2017-10-13 DIAGNOSIS — C50912 Malignant neoplasm of unspecified site of left female breast: Secondary | ICD-10-CM | POA: Insufficient documentation

## 2017-10-15 DIAGNOSIS — F4322 Adjustment disorder with anxiety: Secondary | ICD-10-CM | POA: Insufficient documentation

## 2017-10-15 DIAGNOSIS — F418 Other specified anxiety disorders: Secondary | ICD-10-CM | POA: Insufficient documentation

## 2017-10-22 ENCOUNTER — Other Ambulatory Visit: Payer: Self-pay | Admitting: General Surgery

## 2017-10-22 DIAGNOSIS — D0502 Lobular carcinoma in situ of left breast: Secondary | ICD-10-CM

## 2017-10-22 DIAGNOSIS — R928 Other abnormal and inconclusive findings on diagnostic imaging of breast: Secondary | ICD-10-CM

## 2017-10-30 HISTORY — PX: BREAST LUMPECTOMY: SHX2

## 2017-11-12 ENCOUNTER — Other Ambulatory Visit: Payer: Self-pay | Admitting: General Surgery

## 2017-11-12 ENCOUNTER — Encounter (HOSPITAL_BASED_OUTPATIENT_CLINIC_OR_DEPARTMENT_OTHER): Payer: Self-pay | Admitting: *Deleted

## 2017-11-12 DIAGNOSIS — R928 Other abnormal and inconclusive findings on diagnostic imaging of breast: Secondary | ICD-10-CM

## 2017-11-12 DIAGNOSIS — D0502 Lobular carcinoma in situ of left breast: Secondary | ICD-10-CM

## 2017-11-13 ENCOUNTER — Ambulatory Visit
Admission: RE | Admit: 2017-11-13 | Discharge: 2017-11-13 | Disposition: A | Discharge status: Home / Self Care | Payer: BLUE CROSS/BLUE SHIELD | Source: Ambulatory Visit | Attending: General Surgery | Admitting: General Surgery

## 2017-11-13 DIAGNOSIS — D0502 Lobular carcinoma in situ of left breast: Secondary | ICD-10-CM

## 2017-11-13 DIAGNOSIS — R928 Other abnormal and inconclusive findings on diagnostic imaging of breast: Secondary | ICD-10-CM

## 2017-11-13 NOTE — Progress Notes (Signed)
Pt arrived for ensure presurgery drink. Instructed her to complete the drink by 0500 on DOS. Pt verbalized understanding.

## 2017-11-18 ENCOUNTER — Encounter (HOSPITAL_BASED_OUTPATIENT_CLINIC_OR_DEPARTMENT_OTHER)
Admission: RE | Disposition: A | Discharge status: Home / Self Care | Payer: Self-pay | Source: Ambulatory Visit | Attending: General Surgery

## 2017-11-18 ENCOUNTER — Ambulatory Visit (HOSPITAL_BASED_OUTPATIENT_CLINIC_OR_DEPARTMENT_OTHER)
Admission: RE | Admit: 2017-11-18 | Discharge: 2017-11-18 | Disposition: A | Discharge status: Home / Self Care | Payer: BLUE CROSS/BLUE SHIELD | Source: Ambulatory Visit | Attending: General Surgery | Admitting: General Surgery

## 2017-11-18 ENCOUNTER — Ambulatory Visit (HOSPITAL_BASED_OUTPATIENT_CLINIC_OR_DEPARTMENT_OTHER): Payer: BLUE CROSS/BLUE SHIELD | Admitting: Certified Registered"

## 2017-11-18 ENCOUNTER — Encounter (HOSPITAL_BASED_OUTPATIENT_CLINIC_OR_DEPARTMENT_OTHER): Payer: Self-pay | Admitting: *Deleted

## 2017-11-18 ENCOUNTER — Other Ambulatory Visit: Payer: Self-pay

## 2017-11-18 ENCOUNTER — Ambulatory Visit
Admission: RE | Admit: 2017-11-18 | Discharge: 2017-11-18 | Disposition: A | Discharge status: Home / Self Care | Payer: BLUE CROSS/BLUE SHIELD | Source: Ambulatory Visit | Attending: General Surgery | Admitting: General Surgery

## 2017-11-18 DIAGNOSIS — Z6841 Body Mass Index (BMI) 40.0 and over, adult: Secondary | ICD-10-CM | POA: Diagnosis not present

## 2017-11-18 DIAGNOSIS — Z79890 Hormone replacement therapy (postmenopausal): Secondary | ICD-10-CM | POA: Diagnosis not present

## 2017-11-18 DIAGNOSIS — E039 Hypothyroidism, unspecified: Secondary | ICD-10-CM | POA: Diagnosis not present

## 2017-11-18 DIAGNOSIS — I10 Essential (primary) hypertension: Secondary | ICD-10-CM | POA: Insufficient documentation

## 2017-11-18 DIAGNOSIS — D0592 Unspecified type of carcinoma in situ of left breast: Secondary | ICD-10-CM | POA: Diagnosis present

## 2017-11-18 DIAGNOSIS — D242 Benign neoplasm of left breast: Secondary | ICD-10-CM | POA: Insufficient documentation

## 2017-11-18 DIAGNOSIS — Z79899 Other long term (current) drug therapy: Secondary | ICD-10-CM | POA: Diagnosis not present

## 2017-11-18 DIAGNOSIS — R928 Other abnormal and inconclusive findings on diagnostic imaging of breast: Secondary | ICD-10-CM

## 2017-11-18 DIAGNOSIS — Z87891 Personal history of nicotine dependence: Secondary | ICD-10-CM | POA: Diagnosis not present

## 2017-11-18 DIAGNOSIS — D0502 Lobular carcinoma in situ of left breast: Secondary | ICD-10-CM

## 2017-11-18 DIAGNOSIS — F419 Anxiety disorder, unspecified: Secondary | ICD-10-CM | POA: Diagnosis not present

## 2017-11-18 DIAGNOSIS — I4891 Unspecified atrial fibrillation: Secondary | ICD-10-CM | POA: Insufficient documentation

## 2017-11-18 HISTORY — PX: BREAST LUMPECTOMY WITH RADIOACTIVE SEED LOCALIZATION: SHX6424

## 2017-11-18 HISTORY — DX: Hypothyroidism, unspecified: E03.9

## 2017-11-18 SURGERY — BREAST LUMPECTOMY WITH RADIOACTIVE SEED LOCALIZATION
Anesthesia: General | Site: Breast | Laterality: Left

## 2017-11-18 MED ORDER — DEXAMETHASONE SODIUM PHOSPHATE 10 MG/ML IJ SOLN
INTRAMUSCULAR | Status: AC
  Filled 2017-11-18: qty 1

## 2017-11-18 MED ORDER — FENTANYL CITRATE (PF) 100 MCG/2ML IJ SOLN
INTRAMUSCULAR | Status: AC
  Filled 2017-11-18: qty 2

## 2017-11-18 MED ORDER — OXYCODONE HCL 5 MG PO TABS: 5.0000 mg | ORAL_TABLET | ORAL | Status: DC | PRN

## 2017-11-18 MED ORDER — CHLORHEXIDINE GLUCONATE CLOTH 2 % EX PADS: 6.0000 | MEDICATED_PAD | Freq: Once | CUTANEOUS | Status: DC

## 2017-11-18 MED ORDER — DEXAMETHASONE SODIUM PHOSPHATE 10 MG/ML IJ SOLN
INTRAMUSCULAR | Status: DC | PRN
  Administered 2017-11-18: 10 mg via INTRAVENOUS

## 2017-11-18 MED ORDER — MEPERIDINE HCL 25 MG/ML IJ SOLN: 6.2500 mg | INTRAMUSCULAR | Status: DC | PRN

## 2017-11-18 MED ORDER — LIDOCAINE-EPINEPHRINE (PF) 1 %-1:200000 IJ SOLN
INTRAMUSCULAR | Status: AC
  Filled 2017-11-18: qty 30

## 2017-11-18 MED ORDER — PHENYLEPHRINE 40 MCG/ML (10ML) SYRINGE FOR IV PUSH (FOR BLOOD PRESSURE SUPPORT)
PREFILLED_SYRINGE | INTRAVENOUS | Status: DC | PRN
  Administered 2017-11-18: 80 ug via INTRAVENOUS
  Administered 2017-11-18: 40 ug via INTRAVENOUS
  Administered 2017-11-18: 80 ug via INTRAVENOUS

## 2017-11-18 MED ORDER — LACTATED RINGERS IV SOLN
INTRAVENOUS | Status: DC
  Administered 2017-11-18: 10 mL/h via INTRAVENOUS

## 2017-11-18 MED ORDER — CIPROFLOXACIN IN D5W 400 MG/200ML IV SOLN
INTRAVENOUS | Status: AC
  Filled 2017-11-18: qty 200

## 2017-11-18 MED ORDER — GABAPENTIN 300 MG PO CAPS
300.0000 mg | ORAL_CAPSULE | ORAL | Status: AC
  Administered 2017-11-18: 300 mg via ORAL

## 2017-11-18 MED ORDER — BUPIVACAINE HCL 0.25 % IJ SOLN
INTRAMUSCULAR | Status: DC | PRN
  Administered 2017-11-18: 40 mL via INTRAMUSCULAR

## 2017-11-18 MED ORDER — MIDAZOLAM HCL 2 MG/2ML IJ SOLN
1.0000 mg | INTRAMUSCULAR | Status: DC | PRN
  Administered 2017-11-18: 2 mg via INTRAVENOUS

## 2017-11-18 MED ORDER — CELECOXIB 200 MG PO CAPS
200.0000 mg | ORAL_CAPSULE | ORAL | Status: AC
  Administered 2017-11-18: 200 mg via ORAL

## 2017-11-18 MED ORDER — ONDANSETRON HCL 4 MG/2ML IJ SOLN: 4.0000 mg | Freq: Once | INTRAMUSCULAR | Status: DC | PRN

## 2017-11-18 MED ORDER — PROPOFOL 10 MG/ML IV BOLUS
INTRAVENOUS | Status: DC | PRN
  Administered 2017-11-18: 150 mg via INTRAVENOUS

## 2017-11-18 MED ORDER — LIDOCAINE 2% (20 MG/ML) 5 ML SYRINGE
INTRAMUSCULAR | Status: DC | PRN
  Administered 2017-11-18: 60 mg via INTRAVENOUS

## 2017-11-18 MED ORDER — PHENYLEPHRINE 40 MCG/ML (10ML) SYRINGE FOR IV PUSH (FOR BLOOD PRESSURE SUPPORT)
PREFILLED_SYRINGE | INTRAVENOUS | Status: AC
  Filled 2017-11-18: qty 10

## 2017-11-18 MED ORDER — LIDOCAINE 2% (20 MG/ML) 5 ML SYRINGE
INTRAMUSCULAR | Status: AC
  Filled 2017-11-18: qty 5

## 2017-11-18 MED ORDER — MIDAZOLAM HCL 2 MG/2ML IJ SOLN
INTRAMUSCULAR | Status: AC
  Filled 2017-11-18: qty 2

## 2017-11-18 MED ORDER — SCOPOLAMINE 1 MG/3DAYS TD PT72: 1.0000 | MEDICATED_PATCH | Freq: Once | TRANSDERMAL | Status: DC | PRN

## 2017-11-18 MED ORDER — ACETAMINOPHEN 500 MG PO TABS
1000.0000 mg | ORAL_TABLET | ORAL | Status: AC
  Administered 2017-11-18: 1000 mg via ORAL

## 2017-11-18 MED ORDER — ACETAMINOPHEN 325 MG PO TABS: 650.0000 mg | ORAL_TABLET | ORAL | Status: DC | PRN

## 2017-11-18 MED ORDER — GABAPENTIN 300 MG PO CAPS
ORAL_CAPSULE | ORAL | Status: AC
  Filled 2017-11-18: qty 1

## 2017-11-18 MED ORDER — FENTANYL CITRATE (PF) 100 MCG/2ML IJ SOLN: 25.0000 ug | INTRAMUSCULAR | Status: DC | PRN

## 2017-11-18 MED ORDER — SODIUM CHLORIDE 0.9% FLUSH: 3.0000 mL | Freq: Two times a day (BID) | INTRAVENOUS | Status: DC

## 2017-11-18 MED ORDER — KETOROLAC TROMETHAMINE 30 MG/ML IJ SOLN
INTRAMUSCULAR | Status: AC
  Filled 2017-11-18: qty 1

## 2017-11-18 MED ORDER — ONDANSETRON HCL 4 MG/2ML IJ SOLN
INTRAMUSCULAR | Status: DC | PRN
  Administered 2017-11-18: 4 mg via INTRAVENOUS

## 2017-11-18 MED ORDER — EPHEDRINE 5 MG/ML INJ
INTRAVENOUS | Status: AC
  Filled 2017-11-18: qty 10

## 2017-11-18 MED ORDER — PROPOFOL 10 MG/ML IV BOLUS
INTRAVENOUS | Status: AC
  Filled 2017-11-18: qty 20

## 2017-11-18 MED ORDER — BUPIVACAINE HCL (PF) 0.25 % IJ SOLN
INTRAMUSCULAR | Status: AC
  Filled 2017-11-18: qty 30

## 2017-11-18 MED ORDER — CELECOXIB 200 MG PO CAPS
ORAL_CAPSULE | ORAL | Status: AC
  Filled 2017-11-18: qty 1

## 2017-11-18 MED ORDER — SODIUM CHLORIDE 0.9 % IV SOLN: 250.0000 mL | INTRAVENOUS | Status: DC | PRN

## 2017-11-18 MED ORDER — BUPIVACAINE-EPINEPHRINE 0.25% -1:200000 IJ SOLN
INTRAMUSCULAR | Status: AC
  Filled 2017-11-18: qty 1

## 2017-11-18 MED ORDER — ACETAMINOPHEN 160 MG/5ML PO SOLN: 325.0000 mg | ORAL | Status: DC | PRN

## 2017-11-18 MED ORDER — KETOROLAC TROMETHAMINE 30 MG/ML IJ SOLN
30.0000 mg | Freq: Once | INTRAMUSCULAR | Status: DC | PRN
  Administered 2017-11-18: 30 mg via INTRAVENOUS

## 2017-11-18 MED ORDER — CIPROFLOXACIN IN D5W 400 MG/200ML IV SOLN
400.0000 mg | INTRAVENOUS | Status: AC
  Administered 2017-11-18: 400 mg via INTRAVENOUS

## 2017-11-18 MED ORDER — ACETAMINOPHEN 325 MG PO TABS: 325.0000 mg | ORAL_TABLET | ORAL | Status: DC | PRN

## 2017-11-18 MED ORDER — ACETAMINOPHEN 650 MG RE SUPP: 650.0000 mg | RECTAL | Status: DC | PRN

## 2017-11-18 MED ORDER — ACETAMINOPHEN 500 MG PO TABS
ORAL_TABLET | ORAL | Status: AC
  Filled 2017-11-18: qty 2

## 2017-11-18 MED ORDER — OXYCODONE HCL 5 MG PO TABS: 5.0000 mg | ORAL_TABLET | Freq: Once | ORAL | Status: DC | PRN

## 2017-11-18 MED ORDER — EPHEDRINE SULFATE-NACL 50-0.9 MG/10ML-% IV SOSY
PREFILLED_SYRINGE | INTRAVENOUS | Status: DC | PRN
  Administered 2017-11-18 (×3): 5 mg via INTRAVENOUS

## 2017-11-18 MED ORDER — OXYCODONE HCL 5 MG/5ML PO SOLN: 5.0000 mg | Freq: Once | ORAL | Status: DC | PRN

## 2017-11-18 MED ORDER — ONDANSETRON HCL 4 MG/2ML IJ SOLN
INTRAMUSCULAR | Status: AC
  Filled 2017-11-18: qty 2

## 2017-11-18 MED ORDER — HYDROCODONE-ACETAMINOPHEN 5-325 MG PO TABS: 1.0000 | ORAL_TABLET | Freq: Four times a day (QID) | ORAL | 0 refills | Status: DC | PRN

## 2017-11-18 MED ORDER — SODIUM CHLORIDE 0.9% FLUSH: 3.0000 mL | INTRAVENOUS | Status: DC | PRN

## 2017-11-18 MED ORDER — FENTANYL CITRATE (PF) 100 MCG/2ML IJ SOLN
50.0000 ug | INTRAMUSCULAR | Status: DC | PRN
  Administered 2017-11-18 (×2): 50 ug via INTRAVENOUS

## 2017-11-18 SURGICAL SUPPLY — 45 items
BINDER BREAST XXLRG (GAUZE/BANDAGES/DRESSINGS) ×3 IMPLANT
BLADE SURG 10 STRL SS (BLADE) ×3 IMPLANT
CANISTER SUCT 1200ML W/VALVE (MISCELLANEOUS) ×3 IMPLANT
CHLORAPREP W/TINT 26ML (MISCELLANEOUS) ×3 IMPLANT
CLIP VESOCCLUDE LG 6/CT (CLIP) ×3 IMPLANT
CLOSURE WOUND 1/2 X4 (GAUZE/BANDAGES/DRESSINGS) ×1
COVER BACK TABLE 60X90IN (DRAPES) ×3 IMPLANT
COVER MAYO STAND STRL (DRAPES) ×3 IMPLANT
COVER PROBE W GEL 5X96 (DRAPES) ×3 IMPLANT
DERMABOND ADVANCED (GAUZE/BANDAGES/DRESSINGS) ×2
DERMABOND ADVANCED .7 DNX12 (GAUZE/BANDAGES/DRESSINGS) ×1 IMPLANT
DEVICE DUBIN W/COMP PLATE 8390 (MISCELLANEOUS) ×3 IMPLANT
DRAPE LAPAROSCOPIC ABDOMINAL (DRAPES) ×3 IMPLANT
DRAPE UTILITY XL STRL (DRAPES) ×3 IMPLANT
ELECT COATED BLADE 2.86 ST (ELECTRODE) ×3 IMPLANT
ELECT REM PT RETURN 9FT ADLT (ELECTROSURGICAL) ×3
ELECTRODE REM PT RTRN 9FT ADLT (ELECTROSURGICAL) ×1 IMPLANT
GAUZE SPONGE 4X4 12PLY STRL LF (GAUZE/BANDAGES/DRESSINGS) ×3 IMPLANT
GLOVE BIO SURGEON STRL SZ 6 (GLOVE) ×3 IMPLANT
GLOVE BIOGEL PI IND STRL 6.5 (GLOVE) ×1 IMPLANT
GLOVE BIOGEL PI IND STRL 7.0 (GLOVE) ×2 IMPLANT
GLOVE BIOGEL PI INDICATOR 6.5 (GLOVE) ×2
GLOVE BIOGEL PI INDICATOR 7.0 (GLOVE) ×4
GLOVE ECLIPSE 6.5 STRL STRAW (GLOVE) ×3 IMPLANT
GOWN STRL REUS W/ TWL LRG LVL3 (GOWN DISPOSABLE) ×1 IMPLANT
GOWN STRL REUS W/TWL 2XL LVL3 (GOWN DISPOSABLE) ×3 IMPLANT
GOWN STRL REUS W/TWL LRG LVL3 (GOWN DISPOSABLE) ×2
KIT MARKER MARGIN INK (KITS) ×3 IMPLANT
NEEDLE HYPO 25X1 1.5 SAFETY (NEEDLE) ×3 IMPLANT
NS IRRIG 1000ML POUR BTL (IV SOLUTION) ×3 IMPLANT
PACK BASIN DAY SURGERY FS (CUSTOM PROCEDURE TRAY) ×3 IMPLANT
PENCIL BUTTON HOLSTER BLD 10FT (ELECTRODE) ×3 IMPLANT
SLEEVE SCD COMPRESS KNEE MED (MISCELLANEOUS) ×3 IMPLANT
SPONGE LAP 18X18 X RAY DECT (DISPOSABLE) ×3 IMPLANT
STRIP CLOSURE SKIN 1/2X4 (GAUZE/BANDAGES/DRESSINGS) ×2 IMPLANT
SUT MNCRL AB 4-0 PS2 18 (SUTURE) ×3 IMPLANT
SUT VIC AB 2-0 SH 27 (SUTURE) ×2
SUT VIC AB 2-0 SH 27XBRD (SUTURE) ×1 IMPLANT
SUT VIC AB 3-0 SH 27 (SUTURE) ×2
SUT VIC AB 3-0 SH 27X BRD (SUTURE) ×1 IMPLANT
SYR CONTROL 10ML LL (SYRINGE) ×3 IMPLANT
TOWEL OR 17X24 6PK STRL BLUE (TOWEL DISPOSABLE) ×3 IMPLANT
TUBE CONNECTING 20'X1/4 (TUBING) ×1
TUBE CONNECTING 20X1/4 (TUBING) ×2 IMPLANT
YANKAUER SUCT BULB TIP NO VENT (SUCTIONS) ×3 IMPLANT

## 2017-11-18 NOTE — Anesthesia Preprocedure Evaluation (Signed)
Anesthesia Evaluation  Patient identified by MRN, date of birth, ID band Patient awake    Reviewed: Allergy & Precautions, NPO status , Patient's Chart, lab work & pertinent test results  Airway Mallampati: II  TM Distance: >3 FB Neck ROM: Full    Dental no notable dental hx.    Pulmonary neg pulmonary ROS, former smoker,    Pulmonary exam normal breath sounds clear to auscultation       Cardiovascular hypertension, Pt. on medications Normal cardiovascular exam+ dysrhythmias Atrial Fibrillation  Rhythm:Regular Rate:Normal     Neuro/Psych  Headaches, Anxiety    GI/Hepatic negative GI ROS, Neg liver ROS,   Endo/Other  Hypothyroidism Morbid obesity  Renal/GU negative Renal ROS  negative genitourinary   Musculoskeletal negative musculoskeletal ROS (+)   Abdominal   Peds negative pediatric ROS (+)  Hematology negative hematology ROS (+)   Anesthesia Other Findings   Reproductive/Obstetrics negative OB ROS                             Anesthesia Physical Anesthesia Plan  ASA: II  Anesthesia Plan: General   Post-op Pain Management:    Induction: Intravenous  PONV Risk Score and Plan: 3 and Ondansetron, Dexamethasone, Treatment may vary due to age or medical condition and Midazolam  Airway Management Planned: Oral ETT and LMA  Additional Equipment:   Intra-op Plan:   Post-operative Plan: Extubation in OR  Informed Consent: I have reviewed the patients History and Physical, chart, labs and discussed the procedure including the risks, benefits and alternatives for the proposed anesthesia with the patient or authorized representative who has indicated his/her understanding and acceptance.     Plan Discussed with: CRNA and Anesthesiologist  Anesthesia Plan Comments: (  )       Anesthesia Quick Evaluation

## 2017-11-18 NOTE — Op Note (Signed)
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 The risks, benefits, complications, treatment options, and expected outcomes were discussed with the patient. The possibilities of bleeding, infection, the need for additional procedures, failure to diagnose a condition, and creating a complication requiring transfusion or operation were discussed with the patient. The patient concurred with the proposed plan, giving informed consent.  The site of surgery properly noted/marked. The patient was taken to Operating Room # 8, identified, and the procedure verified as Left Breast seed localized lumpectomy. A Time Out was held and the above information confirmed.  The left breast and chest were prepped and draped in standard fashion. The lumpectomy was performed by creating a superolateral circumareolar incision near the previously placed radioactive seed.  Dissection was carried down to around the point of maximum signal intensity. The cautery was used to perform the dissection.  Hemostasis was achieved with cautery. The edges of the cavity were marked with large clips, with one each medial, lateral, inferior and superior, and two clips posteriorly.   The specimen was inked with the margin marker paint kit.    Specimen radiography confirmed inclusion of the mammographic lesion, the clip, and the seed.  The background signal in the breast was zero.  The wound was irrigated and closed with 3-0 vicryl in layers and 4-0 monocryl subcuticular suture.      Sterile dressings were applied. At the end of the  operation, all sponge, instrument, and needle counts were correct.  Findings: grossly clear surgical margins and no adenopathy  Estimated Blood Loss:  min         Specimens: left breast lumpectomy with seed and clip.         Complications:  None; patient tolerated the procedure well.         Disposition: PACU - hemodynamically stable.         Condition: stable

## 2017-11-18 NOTE — Interval H&P Note (Signed)
History and Physical Interval Note:  11/18/2017 8:31 AM  Theresa Duncan  has presented today for surgery, with the diagnosis of L ABNORMAL MAMMOGRAM  The various methods of treatment have been discussed with the patient and family. After consideration of risks, benefits and other options for treatment, the patient has consented to  Procedure(s): LEFT BREAST LUMPECTOMY WITH RADIOACTIVE SEED LOCALIZATION (Left) as a surgical intervention .  The patient's history has been reviewed, patient examined, no change in status, stable for surgery.  I have reviewed the patient's chart and labs.  Questions were answered to the patient's satisfaction.     Stark Klein

## 2017-11-18 NOTE — Anesthesia Procedure Notes (Signed)
Procedure Name: LMA Insertion Date/Time: 11/18/2017 8:57 AM Performed by: Gwyndolyn Saxon, CRNA Pre-anesthesia Checklist: Patient identified, Emergency Drugs available, Suction available, Patient being monitored and Timeout performed Patient Re-evaluated:Patient Re-evaluated prior to induction Oxygen Delivery Method: Circle system utilized Preoxygenation: Pre-oxygenation with 100% oxygen Induction Type: IV induction Ventilation: Mask ventilation without difficulty LMA: LMA inserted LMA Size: 4.0 Number of attempts: 1 Placement Confirmation: positive ETCO2,  CO2 detector and breath sounds checked- equal and bilateral Tube secured with: Tape Dental Injury: Teeth and Oropharynx as per pre-operative assessment

## 2017-11-18 NOTE — Transfer of Care (Signed)
Immediate Anesthesia Transfer of Care Note  Patient: Theresa Duncan  Procedure(s) Performed: LEFT BREAST LUMPECTOMY WITH RADIOACTIVE SEED LOCALIZATION (Left Breast)  Patient Location: PACU  Anesthesia Type:General  Level of Consciousness: awake  Airway & Oxygen Therapy: Patient Spontanous Breathing and Patient connected to face mask oxygen  Post-op Assessment: Report given to RN and Post -op Vital signs reviewed and stable  Post vital signs: Reviewed and stable  Last Vitals:  Vitals:   11/18/17 0958 11/18/17 1000  BP: 130/73   Pulse:  89  Resp:  17  Temp:  36.6 C  SpO2:  98%    Last Pain:  Vitals:   11/18/17 1000  TempSrc:   PainSc: Asleep         Complications: No apparent anesthesia complications

## 2017-11-18 NOTE — Anesthesia Postprocedure Evaluation (Signed)
Anesthesia Post Note  Patient: Theresa Duncan  Procedure(s) Performed: LEFT BREAST LUMPECTOMY WITH RADIOACTIVE SEED LOCALIZATION (Left Breast)     Patient location during evaluation: PACU Anesthesia Type: General Level of consciousness: awake and alert Pain management: pain level controlled Vital Signs Assessment: post-procedure vital signs reviewed and stable Respiratory status: spontaneous breathing, nonlabored ventilation, respiratory function stable and patient connected to nasal cannula oxygen Cardiovascular status: blood pressure returned to baseline and stable Postop Assessment: no apparent nausea or vomiting Anesthetic complications: no    Last Vitals:  Vitals:   11/18/17 1030 11/18/17 1043  BP: 135/80 (!) 142/94  Pulse: 87 86  Resp: 17 13  Temp:    SpO2: 96% 99%    Last Pain:  Vitals:   11/18/17 1030  TempSrc:   PainSc: 3                  Richey Doolittle

## 2017-11-18 NOTE — H&P (Signed)
Domenick Gong Documented: 10/22/2017 8:48 AM Location: [facility] Patient #: [id number] DOB: 09/07/1956 Married / Language: Cleophus Molt / Race: White Female   History of Present Illness Stark Klein MD; 10/22/2017 5:49 PM) The patient is a 62 year old female who presents with a complaint of Breast problems. Patient is a 62 year old female who is referred for consultation by Dr. Purcell Nails for a diagnosis of an atypical left mammogram. She presented with a left breast distortion that was screening detected on her mammogram in December. 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 She presents to discuss excision. She denies prior breast problems. She has sometimes an anxious person. She does desire to get this taken care of as soon as reasonably possible. She complains of some breast pain at the site of the biopsy.   dx mammogram 09/24/18 ACR Breast Density Category c: The breast tissue is heterogeneously dense, which may obscure small masses.  FINDINGS: The left breast distortion, located at 12 o'clock, persists on additional imaging. No other suspicious findings.  On physical exam, no suspicious lumps are identified.  Targeted ultrasound is performed, showing no sonographic correlate to the distortion.  IMPRESSION: Suspicious distortion at 12 o'clock in the left breast.  RECOMMENDATION: Recommend stereotactic biopsy of the left breast distortion  I have discussed the findings and recommendations with the patient. Results were also provided in writing at the conclusion of the visit. If applicable, a reminder letter will be sent to the patient regarding the next appointment.  BI-RADS CATEGORY 4: Suspicious.  pathology 10/02/17 Diagnosis Breast, left, needle core biopsy, UOQ at anterior depth - MAMMARY CARCINOMA IN-SITU ARISING IN A COMPLEX SCLEROSING LESION - ATYPICAL LOBULAR HYPERPLASIA -  CALCIFICATIONS - SEE COMMENT   Allergies (Tanisha A. Owens Shark, [employer]; 10/22/2017 8:49 AM) No Known Drug Allergies [10/22/2017]: Allergies Reconciled   Medication History (Tanisha A. Owens Shark, RMA; 10/22/2017 8:50 AM) ALPRAZolam (0.25MG  Tablet, Oral) Active. DilTIAZem HCl ER Coated Beads (180MG  Capsule ER 24HR, Oral) Active. Flecainide Acetate (50MG  Tablet, Oral) Active. HydroCHLOROthiazide (12.5MG  Capsule, Oral) Active. Levothyroxine Sodium (100MCG Tablet, Oral) Active. Medications Reconciled    Review of Systems Stark Klein MD; 10/22/2017 5:49 PM) All other systems negative  Vitals (Tanisha A. Brown RMA; 10/22/2017 8:49 AM) 10/22/2017 8:49 AM Weight: 208 lb Height: 60in Body Surface Area: 1.9 m Body Mass Index: 40.62 kg/m  Temp.: 98.84F  Pulse: 78 (Regular)  BP: 126/78 (Sitting, Left Arm, Standard)       Physical Exam Stark Klein MD; 10/22/2017 5:52 PM) General Mental Status-Alert. General Appearance-Consistent with stated age. Hydration-Well hydrated. Voice-Normal.  Head and Neck Head-normocephalic, atraumatic with no lesions or palpable masses. Trachea-midline. Thyroid Gland Characteristics - normal size and consistency.  Eye Eyeball - Bilateral-Extraocular movements intact. Sclera/Conjunctiva - Bilateral-No scleral icterus.  Chest and Lung Exam Chest and lung exam reveals -quiet, even and easy respiratory effort with no use of accessory muscles and on auscultation, normal breath sounds, no adventitious sounds and normal vocal resonance. Inspection Chest Wall - Normal. Back - normal.  Breast Note: Breasts are reasonably symmetric bilaterally. She has mild to moderate ptosis. She has bruising on the left breast at the biopsy site. She has no palpable mass. There is no skin dimpling, nipple retraction, or nipple discharge. There is no lymphadenopathy. Right breast is without abnormalities.   Cardiovascular Cardiovascular  examination reveals -normal heart sounds, regular rate and rhythm with no murmurs and normal pedal pulses bilaterally.  Abdomen Inspection Inspection of  the abdomen reveals - No Hernias. Palpation/Percussion Palpation and Percussion of the abdomen reveal - Soft, Non Tender, No Rebound tenderness, No Rigidity (guarding) and No hepatosplenomegaly. Auscultation Auscultation of the abdomen reveals - Bowel sounds normal.  Neurologic Neurologic evaluation reveals -alert and oriented x 3 with no impairment of recent or remote memory. Mental Status-Normal.  Musculoskeletal Global Assessment -Note: no gross deformities.  Normal Exam - Left-Upper Extremity Strength Normal and Lower Extremity Strength Normal. Normal Exam - Right-Upper Extremity Strength Normal and Lower Extremity Strength Normal.  Lymphatic Head & Neck  General Head & Neck Lymphatics: Bilateral - Description - Normal. Axillary  General Axillary Region: Bilateral - Description - Normal. Tenderness - Non Tender. Femoral & Inguinal  Generalized Femoral & Inguinal Lymphatics: Bilateral - Description - No Generalized lymphadenopathy.    Assessment & Plan Stark Klein MD; 10/22/2017 5:55 PM) ABNORMAL MAMMOGRAM OF LEFT BREAST (R92.8) Impression: I will plan a seed localized lumpectomy. Discussed the reason for surgical excision on a biopsy that sounds benign. I discussed that the purpose for doing this excision is to eliminate the chance of a sampling error with a occult malignancy. I discussed that the risks of breast surgery are low enough and the treatment for breast cancer good enough to warrant the risks of surgery. I reviewed the timeline of surgery.  The surgical procedure was described to the patient. I discussed the incision type and location and that we would need radiology involved on with a wire or seed marker and/or sentinel node.  The risks and benefits of the procedure were described to the patient  and she wishes to proceed.  We discussed the risks bleeding, infection, damage to other structures, need for further procedures/surgeries. We discussed the risk of seroma. The patient was advised if the area in the breast in cancer, we may need to go back to surgery for additional tissue to obtain negative margins or for a lymph node biopsy. The patient was advised that these are the most common complications, but that others can occur as well. They were advised against taking aspirin or other anti-inflammatory agents/blood thinners the week before surgery.  She understands and wishes to proceed. 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 I told her that I recommend seeing oncology once her final pathology is back. I discussed that would review with oncology the risk benefit ratio to taking anti-hormonal treatment. Current Plans Pt Education - [facility] breast cancer surgery: discussed with patient and provided information. You are being scheduled for surgery- Our schedulers will call you.  You should hear from our office's scheduling department within 5 working days about the location, date, and time of surgery. We try to make accommodations for patient's preferences in scheduling surgery, but sometimes the OR schedule or the surgeon's schedule prevents [place] from making those accommodations.  If you have not heard from our office [phone]) in 5 working days, call the office and ask for your surgeon's nurse.  If you have other questions about your diagnosis, plan, or surgery, call the office and ask for your surgeon's nurse.    Signed by Stark Klein, MD (10/22/2017 5:56 PM)

## 2017-11-18 NOTE — Discharge Instructions (Addendum)
[place] Office Phone Number [phone]  BREAST BIOPSY/ PARTIAL MASTECTOMY: POST OP INSTRUCTIONS  Always review your discharge instruction sheet given to you by the facility where your surgery was performed.  IF YOU HAVE DISABILITY OR FAMILY LEAVE FORMS, YOU MUST BRING THEM TO THE OFFICE FOR PROCESSING.  DO NOT GIVE THEM TO YOUR DOCTOR.  1. A prescription for pain medication may be given to you upon discharge.  Take your pain medication as prescribed, if needed.  If narcotic pain medicine is not needed, then you may take acetaminophen (Tylenol) or ibuprofen (Advil) as needed. May take next dose of Tylenol at 1:30pm if needed. May take next dose of Ibuprofen at 4:00pm if needed. 2. Take your usually prescribed medications unless otherwise directed 3. If you need a refill on your pain medication, please contact your pharmacy.  They will contact our office to request authorization.  Prescriptions will not be filled after 5pm or on week-ends. 4. You should eat very light the first 24 hours after surgery, such as soup, crackers, pudding, etc.  Resume your normal diet the day after surgery. 5. Most patients will experience some swelling and bruising in the breast.  Ice packs and a good support bra will help.  Swelling and bruising can take several days to resolve.  6. It is common to experience some constipation if taking pain medication after surgery.  Increasing fluid intake and taking a stool softener will usually help or prevent this problem from occurring.  A mild laxative (Milk of Magnesia or Miralax) should be taken according to package directions if there are no bowel movements after 48 hours. 7. Unless discharge instructions indicate otherwise, you may remove your bandages 48 hours after surgery, and you may shower at that time.  You may have steri-strips (small skin tapes) in place directly over the incision.  These strips should be left on the skin for 7-10 days.   Any  sutures or staples will be removed at the office during your follow-up visit. 8. ACTIVITIES:  You may resume regular daily activities (gradually increasing) beginning the next day.  Wearing a good support bra or sports bra (or the breast binder) minimizes pain and swelling.  You may have sexual intercourse when it is comfortable. a. You may drive when you no longer are taking prescription pain medication, you can comfortably wear a seatbelt, and you can safely maneuver your car and apply brakes. b. RETURN TO WORK:  __________1 week_______________ 9. You should see your doctor in the office for a follow-up appointment approximately two weeks after your surgery.  Your doctors nurse will typically make your follow-up appointment when she calls you with your pathology report.  Expect your pathology report 2-3 business days after your surgery.  You may call to check if you do not hear from [place] after three days.   WHEN TO CALL YOUR DOCTOR: 1. Fever over 101.0 2. Nausea and/or vomiting. 3. Extreme swelling or bruising. 4. Continued bleeding from incision. 5. Increased pain, redness, or drainage from the incision.  The clinic staff is available to answer your questions during regular business hours.  Please dont hesitate to call and ask to speak to one of the nurses for clinical concerns.  If you have a medical emergency, go to the nearest emergency room or call 911.  A surgeon from [facility] is always on call at the hospital.  For further questions, please visit [email or web address]    Post Anesthesia Home Care Instructions  Activity: Get plenty of rest for the remainder of the day. A responsible individual must stay with you for 24 hours following the procedure.  For the next 24 hours, DO NOT: -Drive a car -Paediatric nurse -Drink alcoholic beverages -Take any medication unless instructed by your physician -Make any legal decisions or sign important  papers.  Meals: Start with liquid foods such as gelatin or soup. Progress to regular foods as tolerated. Avoid greasy, spicy, heavy foods. If nausea and/or vomiting occur, drink only clear liquids until the nausea and/or vomiting subsides. Call your physician if vomiting continues.  Special Instructions/Symptoms: Your throat may feel dry or sore from the anesthesia or the breathing tube placed in your throat during surgery. If this causes discomfort, gargle with warm salt water. The discomfort should disappear within 24 hours.  If you had a scopolamine patch placed behind your ear for the management of post- operative nausea and/or vomiting:  1. The medication in the patch is effective for 72 hours, after which it should be removed.  Wrap patch in a tissue and discard in the trash. Wash hands thoroughly with soap and water. 2. You may remove the patch earlier than 72 hours if you experience unpleasant side effects which may include dry mouth, dizziness or visual disturbances. 3. Avoid touching the patch. Wash your hands with soap and water after contact with the patch.

## 2017-11-19 ENCOUNTER — Encounter (HOSPITAL_BASED_OUTPATIENT_CLINIC_OR_DEPARTMENT_OTHER): Payer: Self-pay | Admitting: General Surgery

## 2017-11-24 NOTE — Progress Notes (Signed)
Please let patient know no invasive cancer was found.  Will still do oncology referral as we discussed earlier.

## 2017-11-27 ENCOUNTER — Telehealth: Payer: Self-pay | Admitting: Hematology

## 2017-11-27 NOTE — Telephone Encounter (Signed)
Pt has been scheduled to see Dr. Burr Medico on 3/22 at 230pm. Pt aware to arrive 30 minutes early. I offered an earlier, but the pt preferred a female MD.

## 2017-12-14 ENCOUNTER — Ambulatory Visit: Payer: BLUE CROSS/BLUE SHIELD | Admitting: Oncology

## 2017-12-16 NOTE — Progress Notes (Signed)
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 female is a here because of newly diagnosed LCIS of the left breast. The patient was referred by Dr. Barry Dienes. The patient presents to the clinic today by herself.  Prior to pt's abnormal mammogram she notes she did not feel any lump. Pt had been compliant with yearly mammograms and reports no other abnormal result.  Pt's screening mammogram from 09/15/17 warranted further evaluation. The diagnostic mammogram from 09/24/17 revealed a suspicious distortion at 12 o'clock in the left breast. She underwent a lumpectomy with Dr. Barry Dienes on 11/18/17. Surgical pathology results revealed Lobular Carcinoma in situ arising in a complex slcerosing lesion, intraductal papilloma, and breast calcifications   Today the patient notes she is recovering well from surgery. She has followed up with Dr. Marlowe Aschoff PA twice due to steri strip complications but she has been healing well now. She notes she might have a seroma now.  Pt has a PMHx of A-fib, controlled by medication. She takes 81mg  ASA, and Xanax PRN. She denies personal hx or family history of blood clots. Pt has a FHx of breast CA by her sister (diagnosed at age 51)  She has never been pregnant.   Pt denies tobacco use recently. She states she quit in 2011 and smoked 1/2 pack a day for 30 years.   Socially, the pt works in Therapist, art in an office.   MEDICAL HISTORY:  Past Medical History:  Diagnosis Date  . Anxiety    chronic, panic attacks  . Atrial fibrillation ([place]) 06/02/2010   treated with Cardiazem and Lovenox at [facility], now ASA  . Colon polyp    tubular adenoma  . Hypothyroidism   . Migraine   . Tachycardia 05/2010  .  Thyroid disease 9/ 2011   hypothyroid    SURGICAL HISTORY: Past Surgical History:  Procedure Laterality Date  . APPENDECTOMY  1967  . BREAST LUMPECTOMY WITH RADIOACTIVE SEED LOCALIZATION Left 11/18/2017   Procedure: LEFT BREAST LUMPECTOMY WITH RADIOACTIVE SEED LOCALIZATION;  Surgeon: Stark Klein, MD;  Location: [facility];  Service: General;  Laterality: Left;  . WISDOM TOOTH EXTRACTION      SOCIAL HISTORY: Social History   Socioeconomic History  . Marital status: Married    Spouse name: Not on file  . Number of children: Not on file  . Years of education: Not on file  . Highest education level: Not on file  Occupational History  . Not on file  Social Needs  . Financial resource strain: Not on file  . Food insecurity:    Worry: Not on file    Inability: Not on file  . Transportation needs:    Medical: Not on file    Non-medical: Not on file  Tobacco Use  . Smoking status: Former Smoker    Packs/day: 1.00    Years: 30.00    Pack years: 30.00    Last attempt to quit: 07/05/2010    Years since quitting: 7.4  . Smokeless tobacco: Never Used  Substance and Sexual Activity  . Alcohol use: No  . Drug use: No  . Sexual activity: Yes    Partners: Male    Birth control/protection: Post-menopausal  Lifestyle  . Physical activity:  Days per week: Not on file    Minutes per session: Not on file  . Stress: Not on file  Relationships  . Social connections:    Talks on phone: Not on file    Gets together: Not on file    Attends religious service: Not on file    Active member of club or organization: Not on file    Attends meetings of clubs or organizations: Not on file    Relationship status: Not on file  . Intimate partner violence:    Fear of current or ex partner: Not on file    Emotionally abused: Not on file    Physically abused: Not on file    Forced sexual activity: Not on file  Other Topics Concern  . Not on file  Social History Narrative  .  Not on file    FAMILY HISTORY: Family History  Problem Relation Age of Onset  . Breast cancer Sister 67  . Thyroid disease Sister   . Heart failure Sister        rheumatic fever  . Rheumatic fever Sister   . Diabetes Mother   . Liver disease Mother   . Cirrhosis Mother     ALLERGIES:  is allergic to penicillins and adhesive [tape].  MEDICATIONS:  Current Outpatient Medications  Medication Sig Dispense Refill  . ALPRAZolam (XANAX) 0.25 MG tablet TAKE 1/2 TO 1 TABLET TWICE A DAY AS NEEDED    . aspirin EC 81 MG tablet Take 81 mg by mouth daily.    . Cholecalciferol (VITAMIN D3) 2000 UNITS TABS Take by mouth daily.    Marland Kitchen diltiazem (CARDIZEM CD) 120 MG 24 hr capsule Take 120 mg by mouth daily.  3  . flecainide (TAMBOCOR) 50 MG tablet Take 1 tablet by mouth. Take 75mg  twice daily  6  . ibuprofen (ADVIL,MOTRIN) 200 MG tablet Take 200 mg by mouth every 6 (six) hours as needed for pain.    Marland Kitchen levothyroxine (SYNTHROID, LEVOTHROID) 100 MCG tablet Take 100 mcg by mouth daily before breakfast.   0  . Multiple Vitamin (MULTIVITAMIN) tablet Take 1 tablet by mouth daily.    . clindamycin (CLINDAGEL) 1 % gel Apply topically as needed.    Marland Kitchen HYDROcodone-acetaminophen (NORCO) 5-325 MG tablet Take 1-2 tablets by mouth every 6 (six) hours as needed for moderate pain or severe pain. (Patient not taking: Reported on 12/18/2017) 10 tablet 0  . meclizine (ANTIVERT) 25 MG tablet Take 25 mg by mouth 3 (three) times daily as needed.    . trolamine salicylate (MYOFLEX) 10 % cream Apply topically as needed.     No current facility-administered medications for this visit.     REVIEW OF SYSTEMS:   Constitutional: Denies fevers, chills or abnormal night sweats Eyes: Denies blurriness of vision, double vision or watery eyes Ears, nose, mouth, throat, and face: Denies mucositis or sore throat Respiratory: Denies cough, dyspnea or wheezes Cardiovascular: Denies palpitation, chest discomfort or lower extremity  swelling Gastrointestinal:  Denies nausea, heartburn or change in bowel habits Skin: Denies abnormal skin rashes Lymphatics: Denies new lymphadenopathy or easy bruising Neurological:Denies numbness, tingling or new weaknesses Behavioral/Psych: Mood is stable, no new changes  All other systems were reviewed with the patient and are negative.  PHYSICAL EXAMINATION: ECOG PERFORMANCE STATUS: 0 - Asymptomatic  Vitals:   12/18/17 1425  BP: (!) 158/92  Pulse: 87  Resp: 20  Temp: 98.5 F (36.9 C)  SpO2: 98%   Filed Weights   12/18/17  1425  Weight: 212 lb 3.2 oz (96.3 kg)    GENERAL:alert, no distress and comfortable SKIN: skin color, texture, turgor are normal, no rashes or significant lesions EYES: normal, conjunctiva are pink and non-injected, sclera clear OROPHARYNX:no exudate, no erythema and lips, buccal mucosa, and tongue normal  NECK: supple, thyroid normal size, non-tender, without nodularity LYMPH:  no palpable lymphadenopathy in the cervical, axillary or inguinal LUNGS: clear to auscultation and percussion with normal breathing effort HEART: regular rate & rhythm and no murmurs and no lower extremity edema ABDOMEN:abdomen soft, non-tender and normal bowel sounds Musculoskeletal:no cyanosis of digits and no clubbing  PSYCH: alert & oriented x 3 with fluent speech NEURO: no focal motor/sensory deficits Breast: pt is s/p left breast lumpectomy. Surgical incision is healing well. There is mild erythema towards the lower part of the incision. No open wound or or drainage.   LABORATORY DATA:  I have reviewed the data as listed CBC Latest Ref Rng & Units 01/12/2012  WBC 10:3/mL 5.8  Hemoglobin 12.0 - 16.0 g/dL 13.9  Hematocrit 36 - 46 % 41    CMP Latest Ref Rng & Units 01/12/2012  BUN 4 - 21 mg/dL 12  Creatinine 0.5 - 1.1 mg/dL 0.7  Sodium 137 - 147 mmol/L 141  Potassium 3.4 - 5.3 mmol/L 4.9  AST 13 - 35 U/L 17  ALT 7 - 35 U/L 17   PATHOLOGY  Diagnosis  11/18/17 Breast, lumpectomy, Left - LOBULAR CARCINOMA IN-SITU ARISING IN A COMPLEX SCLEROSING LESION - INTRADUCTAL PAPILLOMA - PREVIOUS BIOPSY SITE CHANGES - CALCIFICATIONS - SEE COMMENT Microscopic Comment The excision has a complex sclerosing lesion with extensive lobular carcinoma in-situ, usual ductal hyperplasia and sclerosing adenosis. Immunohistochemistry (E-ca d, p63, calponin, SMM-1, and cytokeratin 5/6) was performed to exclude invasion and the presence of a ductal component; neither were identified.  Diagnosis 10/02/17 Breast, left, needle core biopsy, UOQ at anterior depth - MAMMARY CARCINOMA IN-SITU ARISING IN A COMPLEX SCLEROSING LESION - ATYPICAL LOBULAR HYPERPLASIA - CALCIFICATIONS - SEE COMMENT Microscopic Comment Based on the biopsy, the carcinoma in situ has a solid pattern, low nuclear grade and measures 0.4 cm in greatest linear extent. E-cadherin is pending and will be reported in an addendum. Dr. Tresa Moore has reviewed the case and agrees with the diagnosis. The results were called to [place] on October 05, 2017.   RADIOGRAPHIC STUDIES: I have personally reviewed the radiological images as listed and agreed with the findings in the report.  Diagnostic Mammogram and [place] 09/24/17 IMPRESSION: Suspicious distortion at 12 o'clock in the left breast. Ultrasound of the left axilla demonstrated no adenopathy.  Screening Mammogram 09/15/17 IMPRESSION: Further evaluation is suggested for possible distortion in the left breast.  No results found.  ASSESSMENT & PLAN:  62 y.o.  postmenopausal Caucasian female  1. 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 -We reviewed the natural history of LCIS. It is considered a benign breast disease, however it does increase the risk of breast cancer by 3-5 fold. It is considered as a high risk for breast cancer. -She underwent a  left Breast lumpectomy with Dr. Barry Dienes on 11/18/17. -Surgical pathology results revealed Lobular Carcinoma in situ arising in a complex slcerosing lesion, intraductal papilloma, and breast calcifications, no evidence of invasive cancer. -We discussed annual mammogram for screening breast cancer. She agrees to continue. She is very compliant on screening. We also discussed the option  of Breast MRI annually alternating with mammogram, which is more sensitive, and it would be helpful given her high risk of breast cancer. -We also discussed healthy diet and regular exercise, calcium and vitamin D supplement, to reduce her risk of breast cancer -We further discussed chemoprevention to breast cancer. I discussed the option of tamoxifen, raloxifene and anastrozole. These endocrine therapy agent is likely going to reduce the risk of breast cancer by 30-40%, however there is no data of survival benefit so far.  -The different side effects of this 3 agents were discussed with patient in great details. Written material was provided to her. I recommend her to consider raloxifene or anastrozole.  -Next mammogram in 08/2018 and Breast MRI in Summer 2020  -She is interested in chemoprevention, she would like to think about it. She will call when she makes a decision and I can call it in.  -She sees a GYN regularly, next appointment in May. Next cardiology appointment in April and colonoscopy at the end of the year.   -She would like to follow-up with me for annual visit even she does not take chemoprevention.  2. Bone health -She has never had a DEXA but her PCP scheduled for her, she is agreeable to have this done.  -She is on vitamins D and calcium supplement -I discussed tamoxifen and raloxifene can increased her bone density and Anastrozole can weaken the bone.   3. Cancer Screening -She is due for a colonoscopy this year, she is compliant  -I recommended her to speak with her PCP about lung cancer screening  due to her history of heavy smoking.    PLAN: -Gave her reading material of Raloxifen and anastrozole for chemoprevention, she will think about it and call us with her decision  -DEXA soon via PCP -Mammogram in 08/2018 and Breast MRI in summer 2020  -F/u in 1 year or sooner if take antiestrogen therapy    No orders of the defined types were placed in this encounter.   All questions were answered. The patient knows to call the clinic with any problems, questions or concerns. I spent 40 minutes counseling the patient face to face. The total time spent in the appointment was 55 minutes and more than 50% was on counseling.   This document serves as a record of services personally performed by Truitt Merle, MD. It was created on her behalf by Theresia Bough, a trained medical scribe. The creation of this record is based on the scribe's personal observations and the provider's statements to them.   I have reviewed the above documentation for accuracy and completeness, and I agree with the above.    Truitt Merle, MD 12/18/2017 5:32 PM

## 2017-12-18 ENCOUNTER — Encounter: Payer: Self-pay | Admitting: Hematology

## 2017-12-18 ENCOUNTER — Telehealth: Payer: Self-pay | Admitting: Hematology

## 2017-12-18 ENCOUNTER — Inpatient Hospital Stay: Payer: BLUE CROSS/BLUE SHIELD | Attending: Hematology | Admitting: Hematology

## 2017-12-18 DIAGNOSIS — E039 Hypothyroidism, unspecified: Secondary | ICD-10-CM | POA: Diagnosis not present

## 2017-12-18 DIAGNOSIS — Z79899 Other long term (current) drug therapy: Secondary | ICD-10-CM | POA: Insufficient documentation

## 2017-12-18 DIAGNOSIS — Z87891 Personal history of nicotine dependence: Secondary | ICD-10-CM | POA: Insufficient documentation

## 2017-12-18 DIAGNOSIS — Z803 Family history of malignant neoplasm of breast: Secondary | ICD-10-CM | POA: Diagnosis not present

## 2017-12-18 DIAGNOSIS — D0502 Lobular carcinoma in situ of left breast: Secondary | ICD-10-CM | POA: Diagnosis not present

## 2017-12-18 DIAGNOSIS — I4891 Unspecified atrial fibrillation: Secondary | ICD-10-CM | POA: Insufficient documentation

## 2017-12-18 DIAGNOSIS — Z7982 Long term (current) use of aspirin: Secondary | ICD-10-CM | POA: Diagnosis not present

## 2017-12-18 NOTE — Telephone Encounter (Signed)
Appointments scheduled AVS printed per 3/22 los ° °

## 2018-02-16 ENCOUNTER — Other Ambulatory Visit (HOSPITAL_COMMUNITY)
Admission: RE | Admit: 2018-02-16 | Discharge: 2018-02-16 | Disposition: A | Discharge status: Home / Self Care | Payer: BLUE CROSS/BLUE SHIELD | Source: Ambulatory Visit | Attending: Obstetrics & Gynecology | Admitting: Obstetrics & Gynecology

## 2018-02-16 ENCOUNTER — Encounter: Payer: Self-pay | Admitting: Certified Nurse Midwife

## 2018-02-16 ENCOUNTER — Ambulatory Visit: Payer: BLUE CROSS/BLUE SHIELD | Admitting: Nurse Practitioner

## 2018-02-16 ENCOUNTER — Ambulatory Visit (INDEPENDENT_AMBULATORY_CARE_PROVIDER_SITE_OTHER): Payer: BLUE CROSS/BLUE SHIELD | Admitting: Certified Nurse Midwife

## 2018-02-16 ENCOUNTER — Other Ambulatory Visit: Payer: Self-pay

## 2018-02-16 VITALS — BP 144/84 | HR 68 | Resp 16 | Ht 59.75 in | Wt 207.0 lb

## 2018-02-16 DIAGNOSIS — Z124 Encounter for screening for malignant neoplasm of cervix: Secondary | ICD-10-CM

## 2018-02-16 DIAGNOSIS — Z01419 Encounter for gynecological examination (general) (routine) without abnormal findings: Secondary | ICD-10-CM

## 2018-02-16 DIAGNOSIS — Z780 Asymptomatic menopausal state: Secondary | ICD-10-CM | POA: Diagnosis not present

## 2018-02-16 DIAGNOSIS — Z86000 Personal history of in-situ neoplasm of breast: Secondary | ICD-10-CM

## 2018-02-16 NOTE — Progress Notes (Signed)
62 y.o. G15P0010 Married  Caucasian Fe here for annual exam. Menopausal no HRT. Denies vaginal bleeding or vaginal dryness. Sees Dr. Cathren Harsh for aex and labs, hypothyroid, anxiety,syncope. Sees Cardiology for atrial fibrillation medication Dr. Percival Spanish every 6  Months. Having pollen allergy issues but saw nurse at work using [place] with good relief. Had low grade mammary CIS, but recommended medication, non sure she wants to do this with  Arimedex. Needs to do mammogram yearly and MRI this year per oncology. Had issues with fluid retention in breast following  Surgery in 2/19 and has follow up with surgeon this week. History of breast cancer with sister, but ? Genetic screening done. Has noted some changes in veins in legs, no swelling. "She should see someone?" No other health issues today.  Patient's last menstrual period was 02/27/2010.          Sexually active: No.  The current method of family planning is post menopausal status.    Exercising: Yes.    walking Smoker:  no  Health Maintenance: Pap:  01-03-15 neg HPV HR neg History of Abnormal Pap: no MMG:  4/19 low grade mammary carcinoma in situ breast biopsy (left breast), with lumpectomy,  Self Breast exams: yes Colonoscopy:  2014 polyps f/u 62yrs due has recall BMD:  due TDaP:  2012 Shingles: no Pneumonia: 2009 Hep C and HIV: not done Labs: if needed   reports that she quit smoking about 7 years ago. She has a 30.00 pack-year smoking history. She has never used smokeless tobacco. She reports that she does not drink alcohol or use drugs.  Past Medical History:  Diagnosis Date  . Anxiety    chronic, panic attacks  . Atrial fibrillation ([place]) 06/02/2010   treated with Cardiazem and Lovenox at [facility], now ASA  . Colon polyp    tubular adenoma  . Hypothyroidism   . Migraine   . Tachycardia 05/2010  . Thyroid disease 9/ 2011   hypothyroid    Past Surgical History:  Procedure Laterality Date  . APPENDECTOMY  1967  . BREAST  LUMPECTOMY WITH RADIOACTIVE SEED LOCALIZATION Left 11/18/2017   Procedure: LEFT BREAST LUMPECTOMY WITH RADIOACTIVE SEED LOCALIZATION;  Surgeon: Stark Klein, MD;  Location: [facility];  Service: General;  Laterality: Left;  . WISDOM TOOTH EXTRACTION      Current Outpatient Medications  Medication Sig Dispense Refill  . ALPRAZolam (XANAX) 0.25 MG tablet TAKE 1/2 TO 1 TABLET TWICE A DAY AS NEEDED    . aspirin EC 81 MG tablet Take 81 mg by mouth daily.    . Cholecalciferol (VITAMIN D3) 2000 UNITS TABS Take by mouth daily.    . clindamycin (CLINDAGEL) 1 % gel Apply topically as needed.    . diltiazem (CARDIZEM CD) 120 MG 24 hr capsule Take 120 mg by mouth daily.  3  . flecainide (TAMBOCOR) 50 MG tablet Take 1 tablet by mouth. Take 75mg  twice daily  6  . HYDROcodone-acetaminophen (NORCO) 5-325 MG tablet Take 1-2 tablets by mouth every 6 (six) hours as needed for moderate pain or severe pain. (Patient not taking: Reported on 12/18/2017) 10 tablet 0  . ibuprofen (ADVIL,MOTRIN) 200 MG tablet Take 200 mg by mouth every 6 (six) hours as needed for pain.    Marland Kitchen levothyroxine (SYNTHROID, LEVOTHROID) 100 MCG tablet Take 100 mcg by mouth daily before breakfast.   0  . meclizine (ANTIVERT) 25 MG tablet Take 25 mg by mouth 3 (three) times daily as needed.    Marland Kitchen  Multiple Vitamin (MULTIVITAMIN) tablet Take 1 tablet by mouth daily.    Marland Kitchen trolamine salicylate (MYOFLEX) 10 % cream Apply topically as needed.     No current facility-administered medications for this visit.     Family History  Problem Relation Age of Onset  . Breast cancer Sister 13  . Thyroid disease Sister   . Heart failure Sister        rheumatic fever  . Rheumatic fever Sister   . Diabetes Mother   . Liver disease Mother   . Cirrhosis Mother     ROS:  Pertinent items are noted in HPI.  Otherwise, a comprehensive ROS was negative.  Exam:   LMP 02/27/2010    Ht Readings from Last 3 Encounters:  12/18/17 5' (1.524 m)   11/18/17 5' (1.524 m)  02/04/17 4' 11.75" (1.518 m)    General appearance: alert, cooperative and appears stated age Head: Normocephalic, without obvious abnormality, atraumatic Neck: no adenopathy, supple, symmetrical, trachea midline and thyroid normal to inspection and palpation Lungs: clear to auscultation bilaterally Breasts: normal appearance, no masses or tenderness, No nipple retraction or dimpling, No nipple discharge or bleeding, No axillary or supraclavicular adenopathy Heart: regular rate and rhythm Abdomen: soft, non-tender; no masses,  no organomegaly Extremities: extremities normal, atraumatic, no cyanosis or edema Skin: Skin color, texture, turgor normal. No rashes or lesions Lymph nodes: Cervical, supraclavicular, and axillary nodes normal. No abnormal inguinal nodes palpated Neurologic: Grossly normal   Pelvic: External genitalia:  no lesions              Urethra:  normal appearing urethra with no masses, tenderness or lesions              Bartholin's and Skene's: normal                 Vagina: normal appearing vagina with normal color and discharge, no lesions              Cervix: no cervical motion tenderness and no lesions              Pap taken: Yes.   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  Encouraged to discuss with surgeon and oncology again to help with her decision with medication use  Continue follow up with MD as indicated  Recommended evaluation of lower legs by the Vein clinic, patient will call.  Patient aware due and will schedule both. Order placed for BMD  Pap smear: yes  counseled on breast self exam, mammography screening, feminine hygiene, osteoporosis, adequate intake of calcium and vitamin D, diet and exercise  return annually or  prn   An After Visit Summary was printed and given to the patient.

## 2018-02-16 NOTE — Patient Instructions (Signed)

## 2018-02-18 LAB — CYTOLOGY - PAP
Diagnosis: NEGATIVE
HPV: NOT DETECTED

## 2018-05-12 ENCOUNTER — Other Ambulatory Visit: Payer: Self-pay | Admitting: General Surgery

## 2018-05-12 ENCOUNTER — Other Ambulatory Visit: Payer: Self-pay | Admitting: Hematology

## 2018-05-12 DIAGNOSIS — Z853 Personal history of malignant neoplasm of breast: Secondary | ICD-10-CM

## 2018-07-01 ENCOUNTER — Ambulatory Visit (HOSPITAL_BASED_OUTPATIENT_CLINIC_OR_DEPARTMENT_OTHER)
Admission: RE | Admit: 2018-07-01 | Discharge: 2018-07-01 | Disposition: A | Discharge status: Home / Self Care | Payer: BLUE CROSS/BLUE SHIELD | Source: Ambulatory Visit | Attending: Family Medicine | Admitting: Family Medicine

## 2018-07-01 ENCOUNTER — Other Ambulatory Visit (HOSPITAL_BASED_OUTPATIENT_CLINIC_OR_DEPARTMENT_OTHER): Payer: Self-pay | Admitting: Family Medicine

## 2018-07-01 DIAGNOSIS — M79604 Pain in right leg: Secondary | ICD-10-CM | POA: Insufficient documentation

## 2018-07-01 DIAGNOSIS — R609 Edema, unspecified: Secondary | ICD-10-CM

## 2018-09-16 ENCOUNTER — Ambulatory Visit
Admission: RE | Admit: 2018-09-16 | Discharge: 2018-09-16 | Disposition: A | Discharge status: Home / Self Care | Payer: BLUE CROSS/BLUE SHIELD | Source: Ambulatory Visit | Attending: Hematology | Admitting: Hematology

## 2018-09-16 DIAGNOSIS — Z853 Personal history of malignant neoplasm of breast: Secondary | ICD-10-CM

## 2018-11-19 ENCOUNTER — Telehealth: Payer: Self-pay | Admitting: Hematology

## 2018-11-19 NOTE — Telephone Encounter (Signed)
Call day 3/27 moved appointments to 3/26 - date/time per patient. Spoke with patient.

## 2018-12-16 ENCOUNTER — Telehealth: Payer: Self-pay | Admitting: Hematology

## 2018-12-16 NOTE — Telephone Encounter (Signed)
Called patient per 3/18 VM scheduling log.  Patient wanted to cancel her appt in March and move it to May.  Okay per MD.  Patient aware of appt date and time.

## 2018-12-23 ENCOUNTER — Ambulatory Visit: Payer: BLUE CROSS/BLUE SHIELD | Admitting: Hematology

## 2018-12-24 ENCOUNTER — Ambulatory Visit: Payer: BLUE CROSS/BLUE SHIELD | Admitting: Hematology

## 2019-01-14 ENCOUNTER — Encounter: Payer: Self-pay | Admitting: Hematology

## 2019-02-01 ENCOUNTER — Encounter: Payer: Self-pay | Admitting: Hematology

## 2019-02-22 ENCOUNTER — Ambulatory Visit: Payer: BLUE CROSS/BLUE SHIELD | Admitting: Certified Nurse Midwife

## 2019-02-23 ENCOUNTER — Telehealth: Payer: Self-pay | Admitting: Hematology

## 2019-02-23 NOTE — Progress Notes (Signed)
[facility]   [phone] Fax:([phone]   Clinic Follow up Note   Patient Care Team: Robyne Peers, MD as PCP - General (Family Medicine) Stark Klein, MD as Consulting Physician (General Surgery) Truitt Merle, MD as Consulting Physician (Hematology)   I connected with Theresa Duncan on 02/24/2019 at  8:15 AM EDT by telephone visit and verified that I am speaking with the correct person using two identifiers.  I discussed the limitations, risks, security and privacy concerns of performing an evaluation and management service by telephone and the availability of in person appointments. I also discussed with the patient that there may be a patient responsible charge related to this service. The patient expressed understanding and agreed to proceed.   Patient's location:  Her home Provider's location:  My office  CHIEF COMPLAINT: F/u of LCIS in the left breast  SUMMARY OF ONCOLOGIC HISTORY: Oncology History   Cancer Staging No matching staging information was found for the patient.        Lobular carcinoma in situ (LCIS) of left breast   09/15/2017 Mammogram    Mammogram 09/15/17 IMPRESSION: Suspicious distortion at 12 o'clock in the left breast.  RECOMMENDATION: Recommend stereotactic biopsy of the left breast distortion     10/02/2017 Initial Biopsy    Diagnosis 10/02/17 Breast, left, needle core biopsy, UOQ at anterior depth - MAMMARY CARCINOMA IN-SITU ARISING IN A COMPLEX SCLEROSING LESION - ATYPICAL LOBULAR HYPERPLASIA - CALCIFICATIONS - SEE COMMENT    11/18/2017 Surgery    LEFT BREAST LUMPECTOMY WITH RADIOACTIVE SEED LOCALIZATION by Dr Barry Dienes 11/18/17     11/18/2017 Pathology Results    Diagnosis 11/18/17 Breast, lumpectomy, Left - LOBULAR CARCINOMA IN-SITU ARISING IN A COMPLEX SCLEROSING LESION - INTRADUCTAL PAPILLOMA - PREVIOUS BIOPSY SITE CHANGES - CALCIFICATIONS - SEE COMMENT    12/18/2017 Initial Diagnosis    Lobular  carcinoma in situ (LCIS) of left breast      CURRENT THERAPY:  Surveillance   INTERVAL HISTORY:  Theresa Duncan is here for a follow up of Left breast LCIS. She was able to identify herself by birth date.  She notes she is doing well. She notes she has seen Dr Barry Dienes since her surgery. Patient feels her incision has healed well and the scar is fading. She is doing self exams and finds no concerns. Dr Barry Dienes did not feel any concerns either. She had latest mammogram in 08/2018. She is not sure about doing breast MRIs yearly as well.  She notes she has been eating more healthy and has lost a few pounds purposefully. She notes her BP has been fair, 130s/80s average. Her cardiologist wants her to continue keeping records of levels.     REVIEW OF SYSTEMS:   Constitutional: Denies fevers, chills (+) purposeful weight loss Eyes: Denies blurriness of vision Ears, nose, mouth, throat, and face: Denies mucositis or sore throat Respiratory: Denies cough, dyspnea or wheezes Cardiovascular: Denies palpitation, chest discomfort or lower extremity swelling Gastrointestinal:  Denies nausea, heartburn or change in bowel habits Skin: Denies abnormal skin rashes Lymphatics: Denies new lymphadenopathy or easy bruising Neurological:Denies numbness, tingling or new weaknesses Behavioral/Psych: Mood is stable, no new changes  All other systems were reviewed with the patient and are negative.  MEDICAL HISTORY:  Past Medical History:  Diagnosis Date  . Anxiety    chronic, panic attacks  . Atrial fibrillation ([place]) 06/02/2010   treated with Cardiazem and Lovenox at [facility], now ASA  . Colon polyp  tubular adenoma  . Hypothyroidism   . Migraine   . Tachycardia 05/2010  . Thyroid disease 9/ 2011   hypothyroid    SURGICAL HISTORY: Past Surgical History:  Procedure Laterality Date  . APPENDECTOMY  1967  . BREAST BIOPSY    . BREAST LUMPECTOMY Left 10/2017  . BREAST LUMPECTOMY WITH RADIOACTIVE  SEED LOCALIZATION Left 11/18/2017   Procedure: LEFT BREAST LUMPECTOMY WITH RADIOACTIVE SEED LOCALIZATION;  Surgeon: Stark Klein, MD;  Location: [place];  Service: General;  Laterality: Left;  . WISDOM TOOTH EXTRACTION      I have reviewed the social history and family history with the patient and they are unchanged from previous note.  ALLERGIES:  is allergic to penicillins and adhesive [tape].  MEDICATIONS:  Current Outpatient Medications  Medication Sig Dispense Refill  . ALPRAZolam (XANAX) 0.25 MG tablet TAKE 1/2 TO 1 TABLET TWICE A DAY AS NEEDED    . aspirin EC 81 MG tablet Take 81 mg by mouth daily.    . Cholecalciferol (VITAMIN D3) 2000 UNITS TABS Take by mouth daily.    . clindamycin (CLINDAGEL) 1 % gel Apply topically as needed.    . Cyanocobalamin (B-12 PO) Take by mouth.    . diltiazem (CARDIZEM CD) 180 MG 24 hr capsule TAKE 1 CAPSULE (180 MG TOTAL) BY MOUTH DAILY FOR 30 DAYS.  3  . flecainide (TAMBOCOR) 50 MG tablet Take 1 tablet by mouth. Take 75mg  twice daily  6  . ibuprofen (ADVIL,MOTRIN) 200 MG tablet Take 200 mg by mouth every 6 (six) hours as needed for pain.    Marland Kitchen levothyroxine (SYNTHROID, LEVOTHROID) 100 MCG tablet Take 100 mcg by mouth daily before breakfast.   0  . meclizine (ANTIVERT) 25 MG tablet Take 25 mg by mouth 3 (three) times daily as needed.    . Multiple Vitamin (MULTIVITAMIN) tablet Take 1 tablet by mouth daily.    Marland Kitchen trolamine salicylate (MYOFLEX) 10 % cream Apply topically as needed.    . TURMERIC PO Take by mouth.     No current facility-administered medications for this visit.     PHYSICAL EXAMINATION: ECOG PERFORMANCE STATUS: 0 - Asymptomatic  No vitals taken today, Exam not performed today   LABORATORY DATA:  I have reviewed the data as listed CBC Latest Ref Rng & Units 01/12/2012  WBC 10:3/mL 5.8  Hemoglobin 12.0 - 16.0 g/dL 13.9  Hematocrit 36 - 46 % 41     CMP Latest Ref Rng & Units 01/12/2012  BUN 4 - 21 mg/dL 12   Creatinine 0.5 - 1.1 mg/dL 0.7  Sodium 137 - 147 mmol/L 141  Potassium 3.4 - 5.3 mmol/L 4.9  AST 13 - 35 U/L 17  ALT 7 - 35 U/L 17      RADIOGRAPHIC STUDIES: I have personally reviewed the radiological images as listed and agreed with the findings in the report. No results found.   ASSESSMENT & PLAN:  Theresa Duncan is a 63 y.o. female with   1. Lobular Carcinoma in-situ of the Left Breast  -She was diagnosed in 09/2017. She is s/p left breast lumpecomty. She had complete resection and no indication of invasive cancer. We discussed that LCIS is benign breast disease but it is a high risk for breast cancer.  -She declined chemoprevention with anti-estrogen therapy. -We previously discussed breast cancer surveillance with regular labs and exams and yearly mammograms. We also discussed the option of Breast MRI or Fast MRI annually alternating with mammogram, which  is more sensitive, and it would be helpful given her high risk of breast cancer starting this summer. She will think about it.  -She is clinically doing well. Her 08/2018 mammogram was unremarkable. She has been doing self breast exam without any concerns. There is no clinical concern for recurrence. -Continue Surveillance. Next mammogram in 08/2019 -I encouraged her to continue to eat healthy balanced diet and stay active with exercise.  -F/u in 1 year   2. Bone health -She has never had a DEXA but her PCP scheduled for her, she is agreeable to have this done.  -She is on vitamins D and calcium supplement  3. Cancer Screening -She is due for a colonoscopy 2019-2020  -I recommended her to speak with her PCP about lung cancer screening due to her history of heavy smoking.    PLAN: -She is clinically doing well  -Lab and f/u in 1 year   No problem-specific Assessment & Plan notes found for this encounter.   No orders of the defined types were placed in this encounter.  I discussed the assessment and treatment plan  with the patient. The patient was provided an opportunity to ask questions and all were answered. The patient agreed with the plan and demonstrated an understanding of the instructions.  The patient was advised to call back or seek an in-person evaluation if the symptoms worsen or if the condition fails to improve as anticipated.   I provided 15 minutes of non face-to-face telephone visit time during this encounter, and > 50% was spent counseling as documented under my assessment & plan.    Truitt Merle, MD 02/24/2019   I, Joslyn Devon, am acting as scribe for Truitt Merle, MD.   I have reviewed the above documentation for accuracy and completeness, and I agree with the above.

## 2019-02-24 ENCOUNTER — Encounter: Payer: Self-pay | Admitting: Hematology

## 2019-02-24 ENCOUNTER — Telehealth: Payer: Self-pay | Admitting: Hematology

## 2019-02-24 ENCOUNTER — Inpatient Hospital Stay: Payer: BLUE CROSS/BLUE SHIELD | Attending: Hematology | Admitting: Hematology

## 2019-02-24 DIAGNOSIS — D0502 Lobular carcinoma in situ of left breast: Secondary | ICD-10-CM

## 2019-02-24 NOTE — Telephone Encounter (Signed)
Scheduled appt per 5/28 los, a calendar will be mailed out.

## 2019-07-18 ENCOUNTER — Ambulatory Visit: Payer: BLUE CROSS/BLUE SHIELD | Admitting: Certified Nurse Midwife

## 2019-07-26 ENCOUNTER — Ambulatory Visit: Payer: BLUE CROSS/BLUE SHIELD | Admitting: Certified Nurse Midwife

## 2019-08-08 ENCOUNTER — Other Ambulatory Visit: Payer: Self-pay

## 2019-08-10 ENCOUNTER — Other Ambulatory Visit: Payer: Self-pay

## 2019-08-10 ENCOUNTER — Encounter: Payer: Self-pay | Admitting: Certified Nurse Midwife

## 2019-08-10 ENCOUNTER — Ambulatory Visit: Payer: BC Managed Care – PPO | Admitting: Certified Nurse Midwife

## 2019-08-10 VITALS — BP 122/82 | HR 70 | Temp 97.1°F | Resp 16 | Ht 59.75 in | Wt 209.0 lb

## 2019-08-10 DIAGNOSIS — Z01419 Encounter for gynecological examination (general) (routine) without abnormal findings: Secondary | ICD-10-CM | POA: Diagnosis not present

## 2019-08-10 NOTE — Patient Instructions (Signed)
EXERCISE AND DIET:  We recommended that you start or continue a regular exercise program for good health. Regular exercise means any activity that makes your heart beat faster and makes you sweat.  We recommend exercising at least 30 minutes per day at least 3 days a week, preferably 4 or 5.  We also recommend a diet low in fat and sugar.  Inactivity, poor dietary choices and obesity can cause diabetes, heart attack, stroke, and kidney damage, among others.   ° °ALCOHOL AND SMOKING:  Women should limit their alcohol intake to no more than 7 drinks/beers/glasses of wine (combined, not each!) per week. Moderation of alcohol intake to this level decreases your risk of breast cancer and liver damage. And of course, no recreational drugs are part of a healthy lifestyle.  And absolutely no smoking or even second hand smoke. Most people know smoking can cause heart and lung diseases, but did you know it also contributes to weakening of your bones? Aging of your skin?  Yellowing of your teeth and nails? ° °CALCIUM AND VITAMIN D:  Adequate intake of calcium and Vitamin D are recommended.  The recommendations for exact amounts of these supplements seem to change often, but generally speaking 600 mg of calcium (either carbonate or citrate) and 800 units of Vitamin D per day seems prudent. Certain women may benefit from higher intake of Vitamin D.  If you are among these women, your doctor will have told you during your visit.   ° °PAP SMEARS:  Pap smears, to check for cervical cancer or precancers,  have traditionally been done yearly, although recent scientific advances have shown that most women can have pap smears less often.  However, every woman still should have a physical exam from her gynecologist every year. It will include a breast check, inspection of the vulva and vagina to check for abnormal growths or skin changes, a visual exam of the cervix, and then an exam to evaluate the size and shape of the uterus and  ovaries.  And after 63 years of age, a rectal exam is indicated to check for rectal cancers. We will also provide age appropriate advice regarding health maintenance, like when you should have certain vaccines, screening for sexually transmitted diseases, bone density testing, colonoscopy, mammograms, etc.  ° °MAMMOGRAMS:  All women over 40 years old should have a yearly mammogram. Many facilities now offer a "3D" mammogram, which may cost around $50 extra out of pocket. If possible,  we recommend you accept the option to have the 3D mammogram performed.  It both reduces the number of women who will be called back for extra views which then turn out to be normal, and it is better than the routine mammogram at detecting truly abnormal areas.   ° °COLONOSCOPY:  Colonoscopy to screen for colon cancer is recommended for all women at age 50.  We know, you hate the idea of the prep.  We agree, BUT, having colon cancer and not knowing it is worse!!  Colon cancer so often starts as a polyp that can be seen and removed at colonscopy, which can quite literally save your life!  And if your first colonoscopy is normal and you have no family history of colon cancer, most women don't have to have it again for 10 years.  Once every ten years, you can do something that may end up saving your life, right?  We will be happy to help you get it scheduled when you are ready.    Be sure to check your insurance coverage so you understand how much it will cost.  It may be covered as a preventative service at no cost, but you should check your particular policy.      Dyslipidemia Dyslipidemia is an imbalance of waxy, fat-like substances (lipids) in the blood. The body needs lipids in small amounts. Dyslipidemia often involves a high level of cholesterol or triglycerides, which are types of lipids. Common forms of dyslipidemia include:  High levels of LDL cholesterol. LDL is the type of cholesterol that causes fatty deposits (plaques)  to build up in the blood vessels that carry blood away from your heart (arteries).  Low levels of HDL cholesterol. HDL cholesterol is the type of cholesterol that protects against heart disease. High levels of HDL remove the LDL buildup from arteries.  High levels of triglycerides. Triglycerides are a fatty substance in the blood that is linked to a buildup of plaques in the arteries. What are the causes? Primary dyslipidemia is caused by changes (mutations) in genes that are passed down through families (inherited). These mutations cause several types of dyslipidemia. Secondary dyslipidemia is caused by lifestyle choices and diseases that lead to dyslipidemia, such as:  Eating a diet that is high in animal fat.  Not getting enough exercise.  Having diabetes, kidney disease, liver disease, or thyroid disease.  Drinking large amounts of alcohol.  Using certain medicines. What increases the risk? You are more likely to develop this condition if you are an older man or if you are a woman who has gone through menopause. Other risk factors include:  Having a family history of dyslipidemia.  Taking certain medicines, including birth control pills, steroids, some diuretics, and beta-blockers.  Smoking cigarettes.  Eating a high-fat diet.  Having certain medical conditions such as diabetes, polycystic ovary syndrome (PCOS), kidney disease, liver disease, or hypothyroidism.  Not exercising regularly.  Being overweight or obese with too much belly fat. What are the signs or symptoms? In most cases, dyslipidemia does not usually cause any symptoms. In severe cases, very high lipid levels can cause:  Fatty bumps under the skin (xanthomas).  White or gray ring around the black center (pupil) of the eye. Very high triglyceride levels can cause inflammation of the pancreas (pancreatitis). How is this diagnosed? Your health care provider may diagnose dyslipidemia based on a routine blood  test (fasting blood test). Because most people do not have symptoms of the condition, this blood testing (lipid profile) is done on adults age 20 and older and is repeated every 5 years. This test checks:  Total cholesterol. This measures the total amount of cholesterol in your blood, including LDL cholesterol, HDL cholesterol, and triglycerides. A healthy number is below 200.  LDL cholesterol. The target number for LDL cholesterol is different for each person, depending on individual risk factors. Ask your health care provider what your LDL cholesterol should be.  HDL cholesterol. An HDL level of 60 or higher is best because it helps to protect against heart disease. A number below 54 for men or below 32 for women increases the risk for heart disease.  Triglycerides. A healthy triglyceride number is below 150. If your lipid profile is abnormal, your health care provider may do other blood tests. How is this treated? Treatment depends on the type of dyslipidemia that you have and your other risk factors for heart disease and stroke. Your health care provider will have a target range for your lipid levels based on this  information. For many people, this condition may be treated by lifestyle changes, such as diet and exercise. Your health care provider may recommend that you:  Get regular exercise.  Make changes to your diet.  Quit smoking if you smoke. If diet changes and exercise do not help you reach your goals, your health care provider may also prescribe medicine to lower lipids. The most commonly prescribed type of medicine lowers your LDL cholesterol (statin drug). If you have a high triglyceride level, your provider may prescribe another type of drug (fibrate) or an omega-3 fish oil supplement, or both. Follow these instructions at home:  Eating and drinking  Follow instructions from your health care provider or dietitian about eating or drinking restrictions.  Eat a healthy diet as  told by your health care provider. This can help you reach and maintain a healthy weight, lower your LDL cholesterol, and raise your HDL cholesterol. This may include: ? Limiting your calories, if you are overweight. ? Eating more fruits, vegetables, whole grains, fish, and lean meats. ? Limiting saturated fat, trans fat, and cholesterol.  If you drink alcohol: ? Limit how much you use. ? Be aware of how much alcohol is in your drink. In the [place] one drink equals one 12 oz bottle of beer (355 mL), one 5 oz glass of wine (148 mL), or one 1 oz glass of hard liquor (44 mL).  Do not drink alcohol if: ? Your health care provider tells you not to drink. ? You are pregnant, may be pregnant, or are planning to become pregnant. Activity  Get regular exercise. Start an exercise and strength training program as told by your health care provider. Ask your health care provider what activities are safe for you. Your health care provider may recommend: ? 30 minutes of aerobic activity 4-6 days a week. Brisk walking is an example of aerobic activity. ? Strength training 2 days a week. General instructions  Do not use any products that contain nicotine or tobacco, such as cigarettes, e-cigarettes, and chewing tobacco. If you need help quitting, ask your health care provider.  Take over-the-counter and prescription medicines only as told by your health care provider. This includes supplements.  Keep all follow-up visits as told by your health care provider. Contact a health care provider if:  You are: ? Having trouble sticking to your exercise or diet plan. ? Struggling to quit smoking or control your use of alcohol. Summary  Dyslipidemia often involves a high level of cholesterol or triglycerides, which are types of lipids.  Treatment depends on the type of dyslipidemia that you have and your other risk factors for heart disease and stroke.  For many people, treatment starts with lifestyle  changes, such as diet and exercise.  Your health care provider may prescribe medicine to lower lipids. This information is not intended to replace advice given to you by your health care provider. Make sure you discuss any questions you have with your health care provider. Document Released: 09/20/2013 Document Revised: 05/10/2018 Document Reviewed: 04/16/2018 Elsevier Patient Education  [place].

## 2019-08-10 NOTE — Progress Notes (Addendum)
63 y.o. G17P0010 Married  Caucasian Fe here for annual exam. Post menopausal denies vaginal bleeding or vaginal dryness. Patient brought labs from work with elevated cholesterol. Had lost weight earlier in year, not working on now. She was working at home with more activity. Mammogram due in 08/2019 with oncology follow up. Sees PCP for hypothyroid management. No other health issues today.  Patient's last menstrual period was 02/27/2010.          Sexually active: No.  The current method of family planning is post menopausal status.    Exercising: Yes.    walking Smoker:  no  Review of Systems  Constitutional: Negative.   HENT: Negative.   Eyes: Negative.   Respiratory: Negative.   Cardiovascular: Negative.   Gastrointestinal: Negative.   Genitourinary: Negative.   Musculoskeletal: Negative.   Skin: Negative.   Neurological: Negative.   Endo/Heme/Allergies: Negative.   Psychiatric/Behavioral: Negative.     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 She has a 30.00 pack-year smoking history. She has never used smokeless tobacco. She reports that she does not drink alcohol or use drugs.  Past Medical History:  Diagnosis Date  . Anxiety    chronic, panic attacks  . Atrial fibrillation ([place]) 06/02/2010   treated with Cardiazem and Lovenox at [facility], now ASA  . Colon polyp    tubular adenoma  . Hypothyroidism   . Migraine   . Tachycardia 05/2010  . Thyroid disease 9/ 2011   hypothyroid    Past Surgical History:  Procedure Laterality Date  . APPENDECTOMY  1967   . BREAST BIOPSY    . BREAST LUMPECTOMY Left 10/2017  . BREAST LUMPECTOMY WITH RADIOACTIVE SEED LOCALIZATION Left 11/18/2017   Procedure: LEFT BREAST LUMPECTOMY WITH RADIOACTIVE SEED LOCALIZATION;  Surgeon: Stark Klein, MD;  Location: [place];  Service: General;  Laterality: Left;  . WISDOM TOOTH EXTRACTION      Current Outpatient Medications  Medication Sig Dispense Refill  . ALPRAZolam (XANAX) 0.25 MG tablet TAKE 1/2 TO 1 TABLET TWICE A DAY AS NEEDED    . aspirin EC 81 MG tablet Take 81 mg by mouth daily.    . Cholecalciferol (VITAMIN D3) 2000 UNITS TABS Take by mouth daily.    . clindamycin (CLINDAGEL) 1 % gel Apply topically as needed.    . Cyanocobalamin (B-12 PO) Take by mouth.    . flecainide (TAMBOCOR) 50 MG tablet Take 1 tablet by mouth. Take 75mg  twice daily  6  . Glucosamine Sulfate 500 MG TABS Take by mouth.    Marland Kitchen ibuprofen (ADVIL,MOTRIN) 200 MG tablet Take 200 mg by mouth every 6 (six) hours as needed for pain.    Marland Kitchen levothyroxine (SYNTHROID) 112 MCG tablet TAKE 1 TABLET BY MOUTH EVERY DAY    . meclizine (ANTIVERT) 25 MG tablet Take 25 mg by mouth 3 (three) times daily as needed.    . Multiple Vitamin (MULTIVITAMIN) tablet Take 1 tablet by mouth daily.     Chalk ER 240 MG 24 hr capsule     .  trolamine salicylate (MYOFLEX) 10 % cream Apply topically as needed.    . TURMERIC PO Take by mouth.     No current facility-administered medications for this visit.     Family History  Problem Relation Age of Onset  . Breast cancer Sister 64  . Thyroid disease Sister   . Heart failure Sister        rheumatic fever  . Rheumatic fever Sister   . Diabetes Mother   . Liver disease Mother   . Cirrhosis Mother     ROS:  Pertinent items are noted in HPI.  Otherwise, a comprehensive ROS was negative.  Exam:   BP 122/82   Pulse 70   Temp (!) 97.1 F (36.2 C) (Skin)   Resp 16   Ht 4' 11.75" (1.518 m)   Wt 209 lb (94.8 kg)   LMP 02/27/2010   BMI 41.16  kg/m  Height: 4' 11.75" (151.8 cm) Ht Readings from Last 3 Encounters:  08/10/19 4' 11.75" (1.518 m)  02/16/18 4' 11.75" (1.518 m)  12/18/17 5' (1.524 m)    General appearance: alert, cooperative and appears stated age Head: Normocephalic, without obvious abnormality, atraumatic Neck: no adenopathy, supple, symmetrical, trachea midline and thyroid normal to inspection and palpation Lungs: clear to auscultation bilaterally Breasts: normal appearance, no masses or tenderness, No nipple retraction or dimpling, No nipple discharge or bleeding, No axillary or supraclavicular adenopathy Heart: regular rate and rhythm Abdomen: soft, non-tender; no masses,  no organomegaly Extremities: extremities normal, atraumatic, no cyanosis or edema Skin: Skin color, texture, turgor normal. No rashes or lesions Lymph nodes: Cervical, supraclavicular, and axillary nodes normal. No abnormal inguinal nodes palpated Neurologic: Grossly normal   Pelvic: External genitalia:  no lesions, normal appearance              Urethra:  normal appearing urethra with no masses, tenderness or lesions              Bartholin's and Skene's: normal                 Vagina: normal appearing vagina with normal color and discharge, no lesions              Cervix: no cervical motion tenderness and no lesions              Pap taken: No. 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  Aware of need to advise if vaginal  bleeding  Continue follow up with oncology as indicated, mammogram due in 08/2019  Discussed starting weight watchers again and working on moving more during the day at work. Food choices discussed and need to increase more fish in diet due to cholesterol elevation.  Keep PCP appointment  Pap smear: no   counseled on breast self exam,  mammography screening, adequate intake of calcium and vitamin D, diet and exercise  return annually or prn  An After Visit Summary was printed and given to the patient.

## 2019-08-16 ENCOUNTER — Other Ambulatory Visit: Payer: Self-pay | Admitting: Family Medicine

## 2019-08-16 DIAGNOSIS — Z853 Personal history of malignant neoplasm of breast: Secondary | ICD-10-CM

## 2019-08-16 DIAGNOSIS — Z9889 Other specified postprocedural states: Secondary | ICD-10-CM

## 2019-09-19 ENCOUNTER — Other Ambulatory Visit: Payer: Self-pay

## 2019-09-19 ENCOUNTER — Ambulatory Visit
Admission: RE | Admit: 2019-09-19 | Discharge: 2019-09-19 | Disposition: A | Discharge status: Home / Self Care | Payer: BLUE CROSS/BLUE SHIELD | Source: Ambulatory Visit | Attending: Family Medicine | Admitting: Family Medicine

## 2019-09-19 DIAGNOSIS — Z9889 Other specified postprocedural states: Secondary | ICD-10-CM

## 2019-09-19 DIAGNOSIS — Z853 Personal history of malignant neoplasm of breast: Secondary | ICD-10-CM

## 2019-12-20 ENCOUNTER — Encounter: Payer: Self-pay | Admitting: Certified Nurse Midwife

## 2020-02-22 ENCOUNTER — Telehealth: Payer: Self-pay | Admitting: Hematology

## 2020-02-22 NOTE — Telephone Encounter (Signed)
Rescheduled appt per 5/26 sch msg. Pt confirmed new appt date and time.

## 2020-02-24 ENCOUNTER — Inpatient Hospital Stay: Payer: BC Managed Care – PPO | Admitting: Hematology

## 2020-02-24 NOTE — Progress Notes (Signed)
[name]   Telephone:([phone]) [phone] [phone]   Clinic Follow up Note   Patient Care Team: Theresa Peers, MD as PCP - General (Family Medicine) Theresa Klein, MD as Consulting Physician (General Surgery) Theresa Merle, MD as Consulting Physician (Hematology)   I connected with Theresa Duncan on 03/01/2020 at 11:40 AM EDT by telephone visit and verified that I am speaking with the correct person using two identifiers.  I discussed the limitations, risks, security and privacy concerns of performing an evaluation and management service by telephone and the availability of in person appointments. I also discussed with the patient that there may be a patient responsible charge related to this service. The patient expressed understanding and agreed to proceed.   Other persons participating in the visit and their role in the encounter:  None  Patient's location: Her office  Provider's location:  My Office   CHIEF COMPLAINT: F/u of LCIS in the left breast  SUMMARY OF ONCOLOGIC HISTORY: Oncology History Overview Note  Cancer Staging No matching staging information was found for the patient.      Lobular carcinoma in situ (LCIS) of left breast  09/15/2017 Mammogram   Mammogram 09/15/17 IMPRESSION: Suspicious distortion at 12 o'clock in the left breast.  RECOMMENDATION: Recommend stereotactic biopsy of the left breast distortion    10/02/2017 Initial Biopsy   Diagnosis 10/02/17 Breast, left, needle core biopsy, UOQ at anterior depth - MAMMARY CARCINOMA IN-SITU ARISING IN A COMPLEX SCLEROSING LESION - ATYPICAL LOBULAR HYPERPLASIA - CALCIFICATIONS - SEE COMMENT   11/18/2017 Surgery   LEFT BREAST LUMPECTOMY WITH RADIOACTIVE SEED LOCALIZATION by Theresa Theresa Duncan 11/18/17    11/18/2017 Pathology Results   Diagnosis 11/18/17 Breast, lumpectomy, Left - LOBULAR CARCINOMA IN-SITU ARISING IN A COMPLEX SCLEROSING LESION - INTRADUCTAL PAPILLOMA - PREVIOUS BIOPSY SITE  CHANGES - CALCIFICATIONS - SEE COMMENT   12/18/2017 Initial Diagnosis   Lobular carcinoma in situ (LCIS) of left breast    Anti-estrogen oral therapy   She declined antiestrogen therapy.       CURRENT THERAPY:  Surveillance   INTERVAL HISTORY:  Theresa Duncan is here for a follow up of left breast LCIS. She was last seen by me 1 year ago. She noes she is doing well. She notes she had bother her COVID19 vaccines.  She notes she continues to f/u with Theresa Duncan and has yearly mammograms. She saw Theresa Theresa Duncan in 09/2019 and she will see her yearly. Before physical in 10/2019 she felt something of her left nipple. After breast exam by PCP office, it was indicated being a cyst. She notes in April she had a few episode of Afib in 12/2019. She had heart monitor on due to this but has not had reports from this. She note no more episodes of this. She notes her BP has been elevated so she will f/u with cardiologist soon.    REVIEW OF SYSTEMS:   Constitutional: Denies fevers, chills or abnormal weight loss Eyes: Denies blurriness of vision Ears, nose, mouth, throat, and face: Denies mucositis or sore throat Respiratory: Denies cough, dyspnea or wheezes Cardiovascular: Denies palpitation, chest discomfort or lower extremity swelling Gastrointestinal:  Denies nausea, heartburn or change in bowel habits Skin: Denies abnormal skin rashes Lymphatics: Denies new lymphadenopathy or easy bruising Neurological:Denies numbness, tingling or new weaknesses Behavioral/Psych: Mood is stable, no new changes  All other systems were reviewed with the patient and are negative.  MEDICAL HISTORY:  Past Medical History:  Diagnosis Date  .  Anxiety    chronic, panic attacks  . Atrial fibrillation ([place]) 06/02/2010   treated with Cardiazem and Lovenox at [facility], now ASA  . Colon polyp    tubular adenoma  . Hypothyroidism   . Migraine   . Tachycardia 05/2010  . Thyroid disease 9/ 2011   hypothyroid     SURGICAL HISTORY: Past Surgical History:  Procedure Laterality Date  . APPENDECTOMY  1967  . BREAST BIOPSY    . BREAST LUMPECTOMY Left 10/2017  . BREAST LUMPECTOMY WITH RADIOACTIVE SEED LOCALIZATION Left 11/18/2017   Procedure: LEFT BREAST LUMPECTOMY WITH RADIOACTIVE SEED LOCALIZATION;  Surgeon: Theresa Klein, MD;  Location: [place];  Service: General;  Laterality: Left;  . WISDOM TOOTH EXTRACTION      I have reviewed the social history and family history with the patient and they are unchanged from previous note.  ALLERGIES:  is allergic to penicillins and adhesive [tape].  MEDICATIONS:  Current Outpatient Medications  Medication Sig Dispense Refill  . ALPRAZolam (XANAX) 0.25 MG tablet TAKE 1/2 TO 1 TABLET TWICE A DAY AS NEEDED    . aspirin EC 81 MG tablet Take 81 mg by mouth daily.    . Cholecalciferol (VITAMIN D3) 2000 UNITS TABS Take by mouth daily.    . clindamycin (CLINDAGEL) 1 % gel Apply topically as needed.    . Cyanocobalamin (B-12 PO) Take by mouth.    . flecainide (TAMBOCOR) 50 MG tablet Take 1 tablet by mouth. Take 75mg  twice daily  6  . Glucosamine Sulfate 500 MG TABS Take by mouth.    Marland Kitchen ibuprofen (ADVIL,MOTRIN) 200 MG tablet Take 200 mg by mouth every 6 (six) hours as needed for pain.    Marland Kitchen levothyroxine (SYNTHROID) 112 MCG tablet TAKE 1 TABLET BY MOUTH EVERY DAY    . meclizine (ANTIVERT) 25 MG tablet Take 25 mg by mouth 3 (three) times daily as needed.    . Multiple Vitamin (MULTIVITAMIN) tablet Take 1 tablet by mouth daily.    Theresa Duncan ER 240 MG 24 hr capsule     . trolamine salicylate (MYOFLEX) 10 % cream Apply topically as needed.    . TURMERIC PO Take by mouth.     No current facility-administered medications for this visit.    PHYSICAL EXAMINATION: ECOG PERFORMANCE STATUS: 0 - Asymptomatic  No vitals taken today, Exam not performed today   LABORATORY DATA:  I have reviewed the data as listed CBC Latest Ref Rng & Units 01/12/2012   WBC 10:3/mL 5.8  Hemoglobin 12.0 - 16.0 g/dL 13.9  Hematocrit 36 - 46 % 41     CMP Latest Ref Rng & Units 01/12/2012  BUN 4 - 21 mg/dL 12  Creatinine 0.5 - 1.1 mg/dL 0.7  Sodium 137 - 147 mmol/L 141  Potassium 3.4 - 5.3 mmol/L 4.9  AST 13 - 35 U/L 17  ALT 7 - 35 U/L 17      RADIOGRAPHIC STUDIES: I have personally reviewed the radiological images as listed and agreed with the findings in the report. No results found.   ASSESSMENT & PLAN:  Theresa Duncan is a 64 y.o. female with    1. LobularCarcinoma in-situ of the Left Breast -She was diagnosed in 09/2017. She is s/p left breast lumpectomy. She had complete resection and no indication of invasive cancer. We discussed that LCIS is benign breast disease but it is a high risk for breast cancer.  -She declined chemoprevention with anti-estrogen therapy. -We previously discussed  breast cancer surveillance with regular labs and exams and yearly mammograms. Today we again discussed the option of Breast MRI or Fast MRI annually alternating with mammogram,which is more sensitive, and it would be helpful given her high risk of breast cancer starting this summer. She declined  -She is clinically doing well. Her 08/2019 mammogram were unremarkable. She notes feeling nodule and notes this being a cyst around left nipple as indicated on breast exam in 10/2019. This was not indicated on Mammogram. There is no clinical concern for recurrence. -Continue Surveillance. Next mammogram in 08/2020. I discussed additional screening with yearly breast MRI. She declined for now.  -I discussed concerning symptoms to look for and if she has concerns she can have examination with me or Theresa Theresa Duncan.  -F/u in 1 year.She will continue to f/u with her other physicians in interim.   2.Bone health -She has never had a DEXA but her PCP scheduled for her, she is agreeable to have this done. -She is on vitamins D and calcium supplement  3. Cancer  Screening -She is due for a colonoscopy  -I recommended her to speak with her PCP about lung cancer screening due to her history ofheavysmoking.    PLAN: -F/u in 1 year with APP   No problem-specific Assessment & Plan notes found for this encounter.   No orders of the defined types were placed in this encounter.  I discussed the assessment and treatment plan with the patient. The patient was provided an opportunity to ask questions and all were answered. The patient agreed with the plan and demonstrated an understanding of the instructions.  The patient was advised to call back or seek an in-person evaluation if the symptoms worsen or if the condition fails to improve as anticipated.  The total time spent in the appointment was 15 minutes.    Theresa Merle, MD 03/01/2020   I, Joslyn Devon, am acting as scribe for Theresa Merle, MD.   I have reviewed the above documentation for accuracy and completeness, and I agree with the above.

## 2020-03-01 ENCOUNTER — Encounter: Payer: Self-pay | Admitting: Hematology

## 2020-03-01 ENCOUNTER — Inpatient Hospital Stay: Payer: BC Managed Care – PPO | Attending: Hematology | Admitting: Hematology

## 2020-03-01 DIAGNOSIS — D0502 Lobular carcinoma in situ of left breast: Secondary | ICD-10-CM

## 2020-03-02 ENCOUNTER — Telehealth: Payer: Self-pay | Admitting: Hematology

## 2020-03-02 NOTE — Telephone Encounter (Signed)
Scheduled appt per 6/3 los.  A calendar will be mailed out,

## 2020-04-05 ENCOUNTER — Other Ambulatory Visit: Payer: Self-pay | Admitting: General Surgery

## 2020-04-05 DIAGNOSIS — N6310 Unspecified lump in the right breast, unspecified quadrant: Secondary | ICD-10-CM

## 2020-04-23 ENCOUNTER — Other Ambulatory Visit: Payer: BC Managed Care – PPO

## 2020-05-03 ENCOUNTER — Other Ambulatory Visit: Payer: Self-pay

## 2020-05-03 ENCOUNTER — Ambulatory Visit
Admission: RE | Admit: 2020-05-03 | Discharge: 2020-05-03 | Disposition: A | Discharge status: Home / Self Care | Payer: BC Managed Care – PPO | Source: Ambulatory Visit | Attending: General Surgery | Admitting: General Surgery

## 2020-05-03 DIAGNOSIS — N6310 Unspecified lump in the right breast, unspecified quadrant: Secondary | ICD-10-CM

## 2020-08-09 ENCOUNTER — Other Ambulatory Visit: Payer: Self-pay | Admitting: Family Medicine

## 2020-08-09 DIAGNOSIS — Z853 Personal history of malignant neoplasm of breast: Secondary | ICD-10-CM

## 2020-08-13 ENCOUNTER — Ambulatory Visit: Payer: BC Managed Care – PPO | Admitting: Certified Nurse Midwife

## 2020-08-30 NOTE — Progress Notes (Signed)
64 y.o. G19P0010 Married White or Caucasian female here for annual exam.    Works in [place] at [employer]. Retiring next month  C/o intermittent vaginal itching.  Patient's last menstrual period was 02/27/2010.          Sexually active: No.  The current method of family planning is post menopausal status.    Exercising: Yes.    walking Smoker:  no  Health Maintenance: Pap:  02-16-18 neg HPV HR neg History of abnormal Pap:  no MMG:  08/2019 malignant lumpectomy left breast, 04/2020 rt breast u/s & diagnostic category b density birads 2:neg, f/u bilateral 08/2020 Colonoscopy:  2019 f/u 9yrs BMD:   none TDaP:  2012 Gardasil:   n/a Covid-19: moderna Pneumonia vaccine(s):  2009 Shingrix:   Not done Hep C testing: not done Screening Labs: done with PCP   reports that she quit smoking about 10 years ago. She has a 30.00 pack-year smoking history. She has never used smokeless tobacco. She reports that she does not drink alcohol and does not use drugs.  Past Medical History:  Diagnosis Date   Anxiety    chronic, panic attacks   Atrial fibrillation ([place]) 06/02/2010   treated with Cardiazem and Lovenox at [facility], now ASA   Colon polyp    tubular adenoma   Hypothyroidism    Migraine    Tachycardia 05/2010   Thyroid disease 9/ 2011   hypothyroid    Past Surgical History:  Procedure Laterality Date   APPENDECTOMY  1967   BREAST BIOPSY     BREAST LUMPECTOMY Left 10/2017   BREAST LUMPECTOMY WITH RADIOACTIVE SEED LOCALIZATION Left 11/18/2017   Procedure: LEFT BREAST LUMPECTOMY WITH RADIOACTIVE SEED LOCALIZATION;  Surgeon: Stark Klein, MD;  Location: [place];  Service: General;  Laterality: Left;   WISDOM TOOTH EXTRACTION      Current Outpatient Medications  Medication Sig Dispense Refill   ALPRAZolam (XANAX) 0.25 MG tablet TAKE 1/2 TO 1 TABLET TWICE A DAY AS NEEDED     aspirin EC 81 MG tablet Take 81 mg by mouth daily.      Cholecalciferol (VITAMIN D3) 2000 UNITS TABS Take by mouth daily.     clindamycin (CLINDAGEL) 1 % gel Apply topically as needed.     diltiazem (CARDIZEM CD) 180 MG 24 hr capsule      flecainide (TAMBOCOR) 100 MG tablet Take 100 mg twice a day-dose change 08/07/2020     Glucosamine Sulfate 500 MG TABS Take by mouth.     ibuprofen (ADVIL,MOTRIN) 200 MG tablet Take 200 mg by mouth every 6 (six) hours as needed for pain.     levothyroxine (SYNTHROID) 112 MCG tablet TAKE 1 TABLET BY MOUTH EVERY DAY     meclizine (ANTIVERT) 25 MG tablet Take 25 mg by mouth 3 (three) times daily as needed.     Multiple Vitamin (MULTIVITAMIN) tablet Take 1 tablet by mouth daily.     trolamine salicylate (MYOFLEX) 10 % cream Apply topically as needed.     TURMERIC PO Take by mouth.     valACYclovir (VALTREX) 500 MG tablet Take 500 mg by mouth daily.     No current facility-administered medications for this visit.    Family History  Problem Relation Age of Onset   Breast cancer Sister 54   Thyroid disease Sister    Heart failure Sister        rheumatic fever   Rheumatic fever Sister    Diabetes Mother  Liver disease Mother    Cirrhosis Mother     Review of Systems  Constitutional: Negative.   HENT: Negative.   Eyes: Negative.   Respiratory: Negative.   Cardiovascular: Negative.   Gastrointestinal: Negative.   Endocrine: Negative.   Genitourinary:       Occ vaginal itching  Musculoskeletal: Negative.   Skin: Negative.   Allergic/Immunologic: Negative.   Neurological: Negative.   Hematological: Negative.   Psychiatric/Behavioral: Negative.     Exam:   BP 118/70    Pulse 70    Resp 16    Ht 4' 11.25" (1.505 m)    Wt 207 lb (93.9 kg)    LMP 02/27/2010    BMI 41.46 kg/m   Height: 4' 11.25" (150.5 cm)  General appearance: alert, cooperative and appears stated age Head: Normocephalic, without obvious abnormality, atraumatic Neck: no adenopathy, supple, symmetrical, trachea  midline and thyroid normal to inspection and palpation Lungs: clear to auscultation bilaterally Breasts: normal appearance, no masses or tenderness Heart: regular rate and rhythm Abdomen: soft, non-tender; bowel sounds normal; no masses,  no organomegaly Extremities: extremities normal, atraumatic, no cyanosis or edema Skin: Skin color, texture, turgor normal. No rashes or lesions Lymph nodes: Cervical, supraclavicular, and axillary nodes normal. No abnormal inguinal nodes palpated Neurologic: Grossly normal   Pelvic: External genitalia:  no lesions              Urethra:  normal appearing urethra with no masses, tenderness or lesions              Bartholins and Skenes: normal                 Vagina: atrophy noted,small amount thin yellowish discharge noted              Cervix: no cervical motion tenderness              Pap taken: No. Bimanual Exam:  Uterus:  normal size, contour, position, consistency, mobility, non-tender              Adnexa: no mass, fullness, tenderness               Rectovaginal: Confirms               Anus:  normal sphincter tone, no lesions  Larene Beach, CMA Chaperone was present for exam.  A:  Well Woman with normal exam  Hx of carcinoma in situ of breast  Vaginal irritation  P:   Mammogram, pt has follow up appointment scheduled this month (still under care of oncology)  pap smear due 2022 Recommend Bone Density after age 38, will order and pt will call [place] [facility]  Affirm testing sent, although appears to be atrophic in nature. Encouraged Replens until results are back. return annually or prn

## 2020-08-31 ENCOUNTER — Encounter: Payer: Self-pay | Admitting: Nurse Practitioner

## 2020-08-31 ENCOUNTER — Ambulatory Visit: Payer: BC Managed Care – PPO | Admitting: Nurse Practitioner

## 2020-08-31 ENCOUNTER — Other Ambulatory Visit: Payer: Self-pay

## 2020-08-31 VITALS — BP 118/70 | HR 70 | Resp 16 | Ht 59.25 in | Wt 207.0 lb

## 2020-08-31 DIAGNOSIS — N898 Other specified noninflammatory disorders of vagina: Secondary | ICD-10-CM

## 2020-08-31 DIAGNOSIS — Z01419 Encounter for gynecological examination (general) (routine) without abnormal findings: Secondary | ICD-10-CM

## 2020-08-31 NOTE — Patient Instructions (Signed)

## 2020-09-02 LAB — VAGINITIS/VAGINOSIS, DNA PROBE
Candida Species: NEGATIVE
Gardnerella vaginalis: NEGATIVE
Trichomonas vaginosis: NEGATIVE

## 2020-09-04 ENCOUNTER — Telehealth: Payer: Self-pay

## 2020-09-04 NOTE — Telephone Encounter (Signed)
Patient is returning a call to Joy. °

## 2020-09-04 NOTE — Telephone Encounter (Signed)
Patient notified of results. See lab 

## 2020-09-04 NOTE — Telephone Encounter (Signed)
Patient states she will callback to get lab results.

## 2020-09-04 NOTE — Telephone Encounter (Signed)
-----   Message from Karma Ganja, NP sent at 09/03/2020  8:22 AM EST ----- Sent in [place]

## 2020-09-06 ENCOUNTER — Ambulatory Visit: Payer: Self-pay | Admitting: Obstetrics and Gynecology

## 2020-09-20 ENCOUNTER — Other Ambulatory Visit: Payer: Self-pay

## 2020-09-20 ENCOUNTER — Ambulatory Visit
Admission: RE | Admit: 2020-09-20 | Discharge: 2020-09-20 | Disposition: A | Discharge status: Home / Self Care | Payer: BC Managed Care – PPO | Source: Ambulatory Visit | Attending: Family Medicine | Admitting: Family Medicine

## 2020-09-20 DIAGNOSIS — Z853 Personal history of malignant neoplasm of breast: Secondary | ICD-10-CM

## 2020-10-25 IMAGING — MG MM DIGITAL DIAGNOSTIC UNILAT*R* W/ TOMO W/ CAD
6 series · 7 of 18 positions shown · non-contrast
Comparison: Previous exam(s).
COMPARISON: Previous exam(s).

Addendum:
CLINICAL DATA: 64-year-old with a possible palpable lump behind the
RIGHT nipple which she has noted for approximately 6 months. Patient
states that the lump is less palpable currently. Personal history of
malignant lumpectomy of the LEFT breast in 2293.

EXAM:
DIGITAL DIAGNOSTIC RIGHT MAMMOGRAM WITH CAD AND TOMO
ULTRASOUND RIGHT BREAST

[R CC synth-2D (1 of 2)]
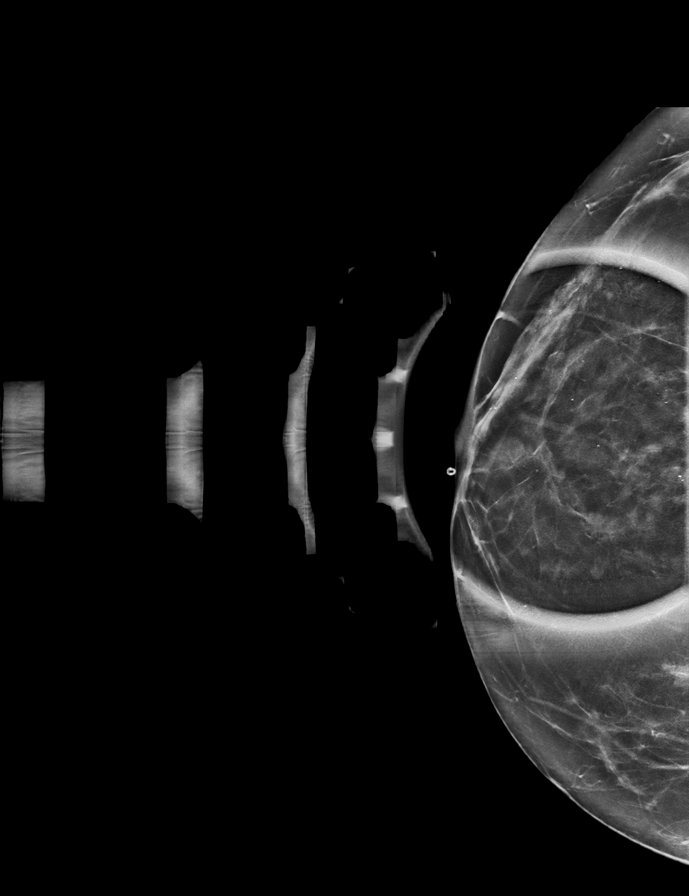

[R CC synth-2D (2 of 2)]
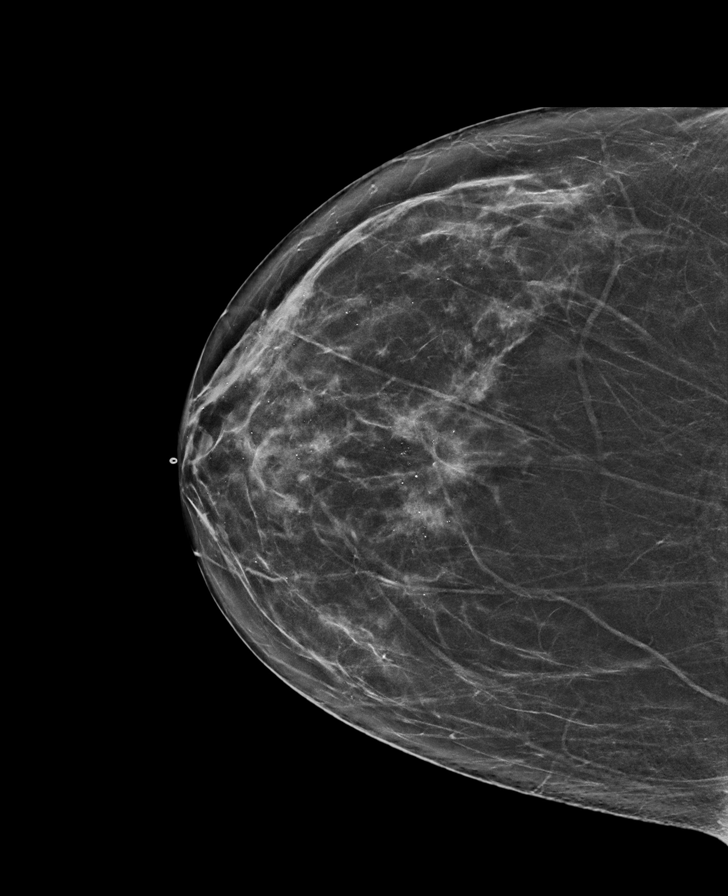

[R MLO synth-2D]
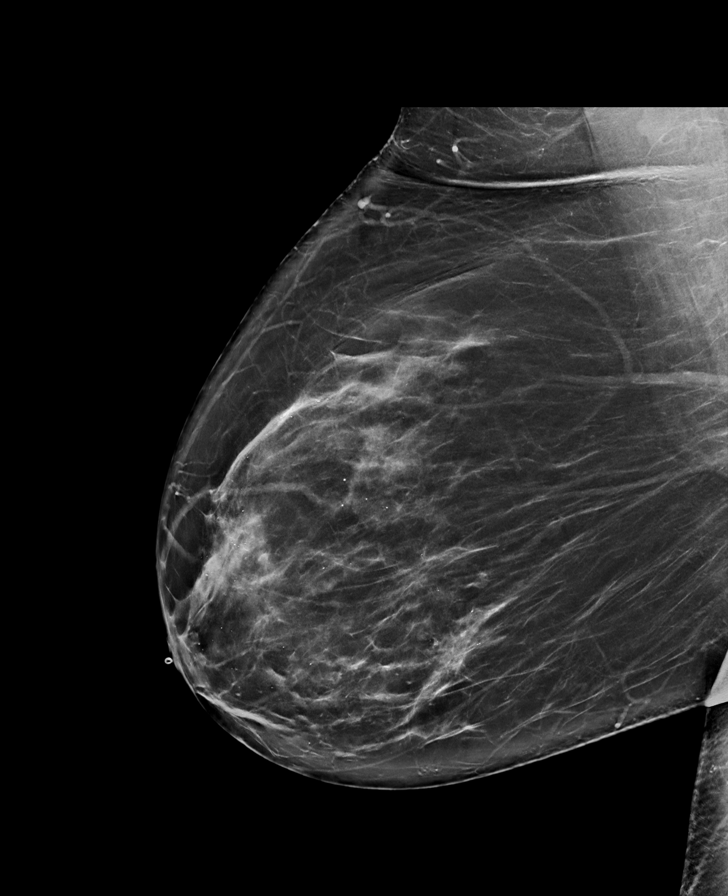

[R CC tomo · 2 of 57 frames shown (1 of 2)]
[frame 19/57]
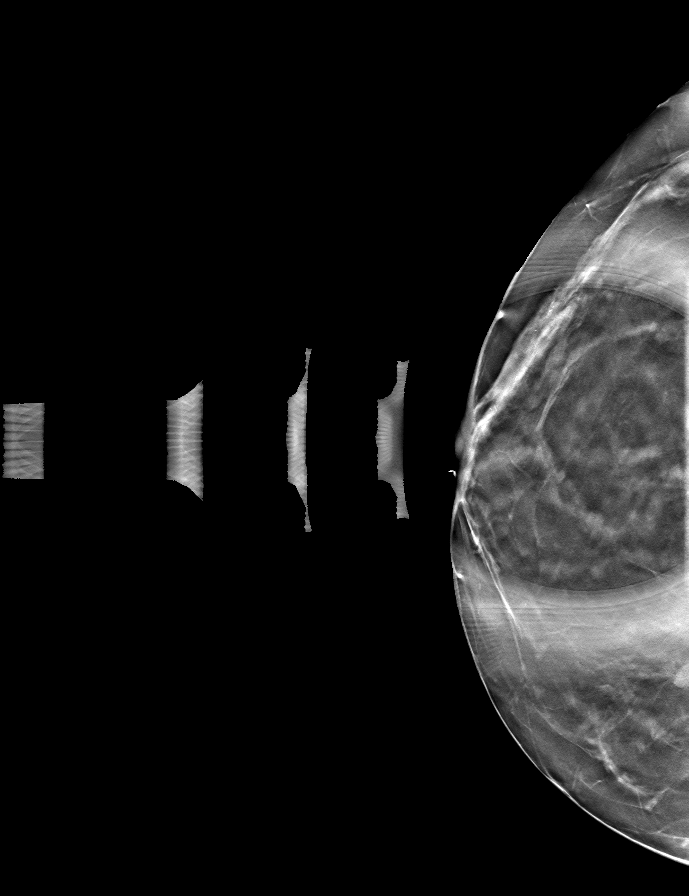
[frame 29/57]
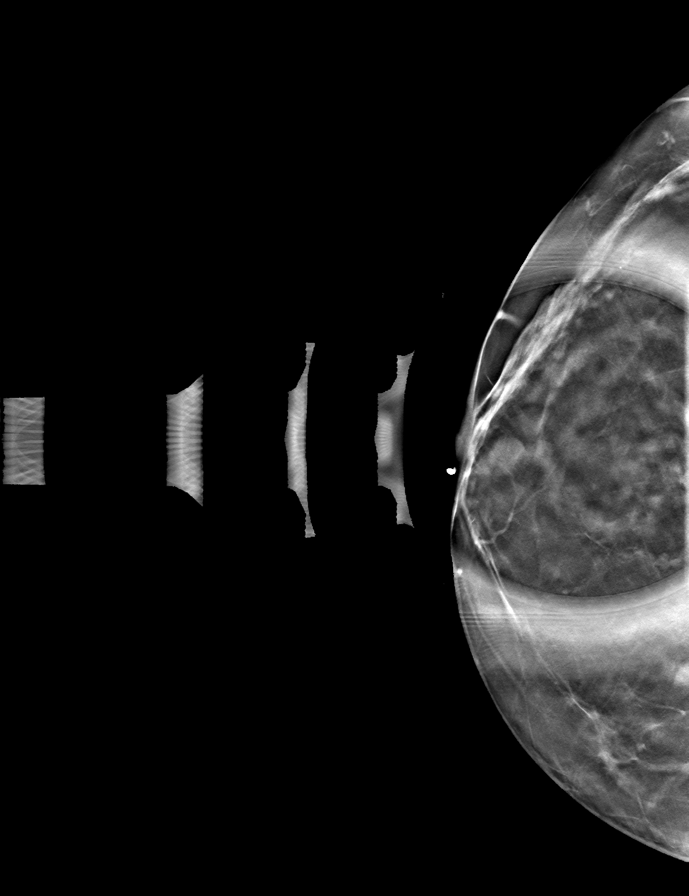

[R CC tomo (2 of 2) · tomo slice 36/71.0]
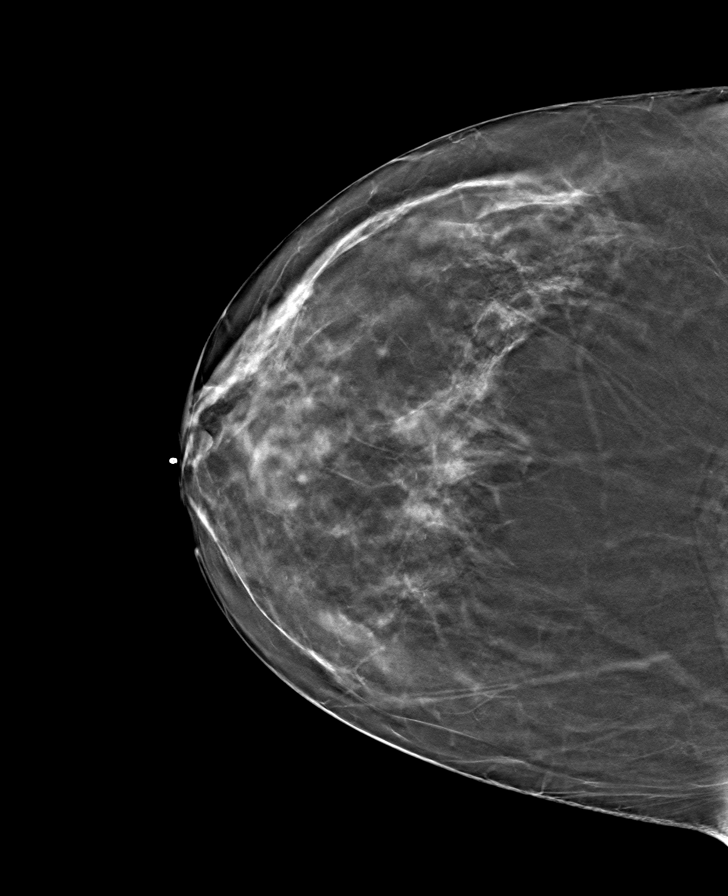

[R MLO tomo · tomo slice 45/88.0]
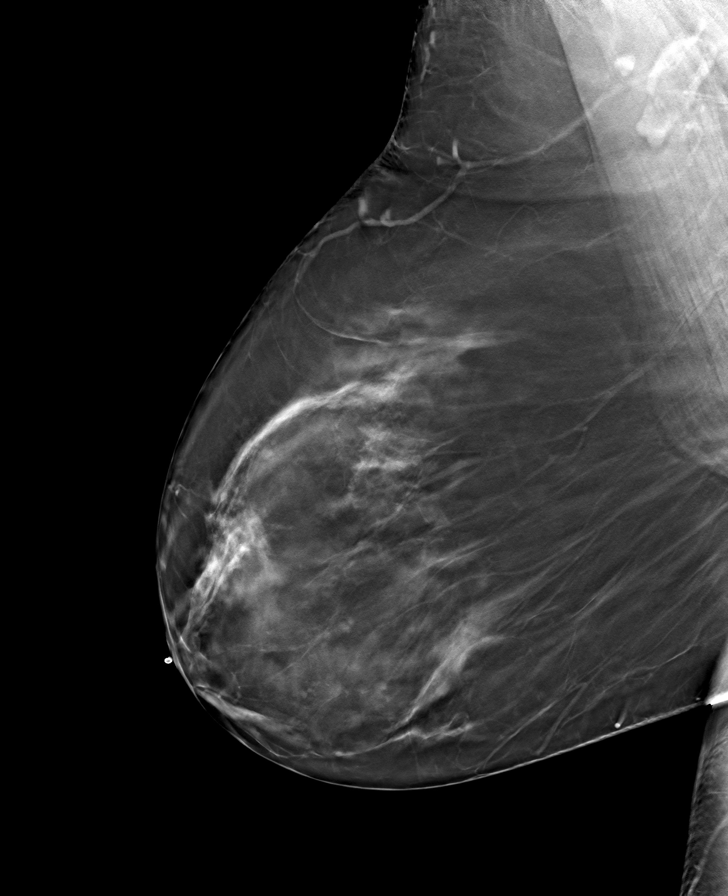

[7 of 18 positions shown; findings below may reference images not displayed]

ACR Breast Density Category b: There are scattered areas of
fibroglandular density.
FINDINGS: Tomosynthesis and synthesized full field CC and MLO views of the
RIGHT breast were obtained. Tomosynthesis and synthesized spot
compression tangential view of the area of concern in the RIGHT
breast was also obtained.

Spot tangential images demonstrate circumscribed isodense mass
measuring approximately 7-8 mm directly behind nipple. There is no
associated architectural distortion or suspicious calcifications.
This mass has been present on multiple prior. No new or suspicious
findings in the RIGHT breast.

Mammographic images were processed with CAD.

On correlate physical exam, there is a palpable pea-sized lump in
the UPPER subareolar location corresponding to what the patient is
feeling.

Targeted RIGHT breast ultrasound is performed, showing a benign
simple cyst at the 12 o'clock subareolar location measuring
approximately 7 x 6 x 5 mm, demonstrating posterior acoustic
enhancement and no internal power Doppler flow, corresponding to
palpable.
IMPRESSION: 1. No mammographic or sonographic evidence of malignancy involving
the RIGHT breast.
2. Benign 7 mm cyst in the UPPER subareolar RIGHT breast which
accounts for the palpable concern.

RECOMMENDATION:
Annual BILATERAL diagnostic mammography which is due in August 2018.

I have discussed the findings and recommendations with the patient.
If applicable, a reminder letter will be sent to the patient
regarding the next appointment.

BI-RADS CATEGORY  2: Benign.

ADDENDUM:
In the Recommendation section, the follow-up should be in August 2020.

*** End of Addendum ***
ACR Breast Density Category b: There are scattered areas of
fibroglandular density.
FINDINGS: Tomosynthesis and synthesized full field CC and MLO views of the
RIGHT breast were obtained. Tomosynthesis and synthesized spot
compression tangential view of the area of concern in the RIGHT
breast was also obtained.

Spot tangential images demonstrate circumscribed isodense mass
measuring approximately 7-8 mm directly behind nipple. There is no
associated architectural distortion or suspicious calcifications.
This mass has been present on multiple prior. No new or suspicious
findings in the RIGHT breast.

Mammographic images were processed with CAD.

On correlate physical exam, there is a palpable pea-sized lump in
the UPPER subareolar location corresponding to what the patient is
feeling.

Targeted RIGHT breast ultrasound is performed, showing a benign
simple cyst at the 12 o'clock subareolar location measuring
approximately 7 x 6 x 5 mm, demonstrating posterior acoustic
enhancement and no internal power Doppler flow, corresponding to
palpable.
IMPRESSION: 1. No mammographic or sonographic evidence of malignancy involving
the RIGHT breast.
2. Benign 7 mm cyst in the UPPER subareolar RIGHT breast which
accounts for the palpable concern.

RECOMMENDATION:
Annual BILATERAL diagnostic mammography which is due in August 2018.

I have discussed the findings and recommendations with the patient.
If applicable, a reminder letter will be sent to the patient
regarding the next appointment.

BI-RADS CATEGORY  2: Benign.

## 2020-10-30 ENCOUNTER — Other Ambulatory Visit: Payer: Self-pay | Admitting: Nurse Practitioner

## 2020-10-30 ENCOUNTER — Other Ambulatory Visit: Payer: Self-pay | Admitting: Family Medicine

## 2020-10-30 DIAGNOSIS — Z01419 Encounter for gynecological examination (general) (routine) without abnormal findings: Secondary | ICD-10-CM

## 2020-10-30 DIAGNOSIS — Z9889 Other specified postprocedural states: Secondary | ICD-10-CM

## 2020-11-28 ENCOUNTER — Telehealth: Payer: Self-pay | Admitting: Nurse Practitioner

## 2020-11-28 NOTE — Telephone Encounter (Signed)
Left message with moved upcoming appointment due to provider's template. Gave option to call back to reschedule if needed. 

## 2021-02-21 ENCOUNTER — Telehealth: Payer: Self-pay | Admitting: Nurse Practitioner

## 2021-02-21 NOTE — Telephone Encounter (Signed)
R/s appt per 5/26 sch msg. Pt requested to do a phone visit. Appt r/s and pt aware.

## 2021-02-28 ENCOUNTER — Inpatient Hospital Stay: Payer: BC Managed Care – PPO | Admitting: Nurse Practitioner

## 2021-03-08 DIAGNOSIS — I4892 Unspecified atrial flutter: Secondary | ICD-10-CM | POA: Insufficient documentation

## 2021-03-27 ENCOUNTER — Other Ambulatory Visit: Payer: Self-pay

## 2021-03-27 ENCOUNTER — Inpatient Hospital Stay: Payer: Medicare Other | Attending: Nurse Practitioner | Admitting: Nurse Practitioner

## 2021-03-27 ENCOUNTER — Encounter: Payer: Self-pay | Admitting: Nurse Practitioner

## 2021-03-27 DIAGNOSIS — D0502 Lobular carcinoma in situ of left breast: Secondary | ICD-10-CM | POA: Diagnosis not present

## 2021-03-27 NOTE — Progress Notes (Signed)
[place]   Telephone:(336) [phone] Fax:(336) [phone]   Clinic Follow up Note   Patient Care Team: Robyne Peers, MD as PCP - General (Family Medicine) Stark Klein, MD as Consulting Physician (General Surgery) Truitt Merle, MD as Consulting Physician (Hematology) 03/27/2021  I connected with Theresa Duncan on 03/27/21 at 10:30 AM EDT by telephone visit and verified that I am speaking with the correct person using two identifiers.   I discussed the limitations, risks, security and privacy concerns of performing an evaluation and management service by telemedicine and the availability of in-person appointments. I also discussed with the patient that there may be a patient responsible charge related to this service. The patient expressed understanding and agreed to proceed.   Other persons participating in the visit and their role in the encounter: None   Patient's location: Home Provider's location: [place] office   CHIEF COMPLAINT: Follow-up LCIS  SUMMARY OF ONCOLOGIC HISTORY: Oncology History Overview Note  Cancer Staging No matching staging information was found for the patient.       Lobular carcinoma in situ (LCIS) of left breast  09/15/2017 Mammogram   Mammogram 09/15/17 IMPRESSION: Suspicious distortion at 12 o'clock in the left breast.   RECOMMENDATION: Recommend stereotactic biopsy of the left breast distortion     10/02/2017 Initial Biopsy   Diagnosis 10/02/17 Breast, left, needle core biopsy, UOQ at anterior depth - MAMMARY CARCINOMA IN-SITU ARISING IN A COMPLEX SCLEROSING LESION - ATYPICAL LOBULAR HYPERPLASIA - CALCIFICATIONS - SEE COMMENT    11/18/2017 Surgery   LEFT BREAST LUMPECTOMY WITH RADIOACTIVE SEED LOCALIZATION by Dr Barry Dienes 11/18/17     11/18/2017 Pathology Results   Diagnosis 11/18/17 Breast, lumpectomy, Left - LOBULAR CARCINOMA IN-SITU ARISING IN A COMPLEX SCLEROSING LESION - INTRADUCTAL PAPILLOMA - PREVIOUS BIOPSY SITE  CHANGES - CALCIFICATIONS - SEE COMMENT    12/18/2017 Initial Diagnosis   Lobular carcinoma in situ (LCIS) of left breast     Anti-estrogen oral therapy   She declined antiestrogen therapy.      CURRENT THERAPY: Surveillance   INTERVAL HISTORY: Theresa Duncan presents for virtual visit as scheduled.  She was last seen by phone by Dr. Burr Medico on 03/28/2020.  She continues follow-ups with her care team, had a breast exam by PCP, GYN, and Dr. Barry Dienes in the last year.  She has no concerns such as new lump/mass, nipple discharge or inversion, or skin change.  She retired at the end of last year, adjusting to her new routine.  Energy and appetite are adequate.  She is trying to lose weight intentionally.  She had abdominal burning that resolved on omeprazole.  Denies changes in her bowel habits, pain, bleeding, fever, chills, cough, chest pain, dyspnea.   MEDICAL HISTORY:  Past Medical History:  Diagnosis Date   Anxiety    chronic, panic attacks   Atrial fibrillation ([place]) 06/02/2010   treated with Cardiazem and Lovenox at [facility], now ASA   Colon polyp    tubular adenoma   Hypothyroidism    Migraine    Tachycardia 05/2010   Thyroid disease 9/ 2011   hypothyroid    SURGICAL HISTORY: Past Surgical History:  Procedure Laterality Date   APPENDECTOMY  1967   BREAST BIOPSY     BREAST LUMPECTOMY Left 10/2017   BREAST LUMPECTOMY WITH RADIOACTIVE SEED LOCALIZATION Left 11/18/2017   Procedure: LEFT BREAST LUMPECTOMY WITH RADIOACTIVE SEED LOCALIZATION;  Surgeon: Stark Klein, MD;  Location: [place];  Service: General;  Laterality:  Left;   WISDOM TOOTH EXTRACTION      I have reviewed the social history and family history with the patient and they are unchanged from previous note.  ALLERGIES:  is allergic to penicillins and adhesive [tape].  MEDICATIONS:  Current Outpatient Medications  Medication Sig Dispense Refill   ALPRAZolam (XANAX) 0.25 MG tablet TAKE 1/2 TO 1 TABLET  TWICE A DAY AS NEEDED     aspirin EC 81 MG tablet Take 81 mg by mouth daily.     Cholecalciferol (VITAMIN D3) 2000 UNITS TABS Take by mouth daily.     clindamycin (CLINDAGEL) 1 % gel Apply topically as needed.     diltiazem (CARDIZEM CD) 180 MG 24 hr capsule Take 180 mg by mouth daily.     diltiazem (TIAZAC) 120 MG 24 hr capsule Take 1 capsule by mouth daily.     flecainide (TAMBOCOR) 100 MG tablet Take 100 mg by mouth 2 (two) times daily.     Glucosamine Sulfate 500 MG TABS Take by mouth.     ibuprofen (ADVIL,MOTRIN) 200 MG tablet Take 200 mg by mouth every 6 (six) hours as needed for pain.     levothyroxine (SYNTHROID) 112 MCG tablet TAKE 1 TABLET BY MOUTH EVERY DAY     meclizine (ANTIVERT) 25 MG tablet Take 25 mg by mouth 3 (three) times daily as needed.     Multiple Vitamin (MULTIVITAMIN) tablet Take 1 tablet by mouth daily.     trolamine salicylate (ASPERCREME) 10 % cream Apply topically as needed.     TURMERIC PO Take by mouth.     valACYclovir (VALTREX) 500 MG tablet Take 500 mg by mouth daily.     No current facility-administered medications for this visit.    PHYSICAL EXAMINATION: ECOG PERFORMANCE STATUS: 0 - Asymptomatic  There were no vitals filed for this visit. There were no vitals filed for this visit.  Patient appears well over the phone.  Voice is strong, speech is clear.  No cough or conversational dyspnea.  LABORATORY DATA:  No labs for this visit     RADIOGRAPHIC STUDIES: I have personally reviewed the radiological images as listed and agreed with the findings in the report. No results found.   ASSESSMENT & PLAN: Theresa Duncan is a 65 y.o. female with     1. Lobular Carcinoma in-situ of the Left Breast  -She was diagnosed in 09/2017. S/p left breast lumpectomy. She had complete resection and no indication of invasive cancer.  -She declined chemoprevention with anti-estrogen therapy. -She is on surveillance with annual mammograms. Additional screening  breast MRI has been recommended, she continues to decline.  -F/u in 1 year. She will continue to f/u with her other physicians in interim.    2. Bone health -continue calcium and vitamin D -DEXA 09/24/21   3. Cancer Screening -reportedly up to date, colonoscopy due 04/2021    Disposition: Theresa Duncan is clinically doing well.  08/2020 mammogram was unremarkable.  Recent outside labs are normal.  There is no clinical concern for recurrence.  Continue surveillance, next mammogram 08/2021.  She will think about screening breast MRI.  She is currently up-to-date on age-appropriate cancer screenings, colonoscopy due 04/2021.  I encouraged her to continue healthy lifestyle, physical exercise, avoiding smoking, limiting alcohol, and continuing calcium with vitamin D.  DEXA scheduled with mammogram in December.  She prefers to continue virtual follow-up in our clinic, agreeable to breast exam by PCP/GYN, and Dr. Barry Dienes annually.  We will see her  back in a year, or sooner if needed.  I discussed the assessment and treatment plan with the patient. The patient was provided an opportunity to ask questions and all were answered. The patient agreed with the plan and demonstrated an understanding of the instructions.   The patient was advised to call back or seek an in-person evaluation if conditions worsen or she detects new changes. All questions were answered. The patient knows to call the clinic with any problems, questions or concerns. No barriers to learning were detected.     Alla Feeling, NP 03/27/21

## 2021-04-02 ENCOUNTER — Telehealth: Payer: Self-pay | Admitting: Hematology

## 2021-04-02 NOTE — Telephone Encounter (Signed)
Left message with follow-up appointments per 6/29 los. 

## 2021-05-19 DIAGNOSIS — K56609 Unspecified intestinal obstruction, unspecified as to partial versus complete obstruction: Secondary | ICD-10-CM | POA: Insufficient documentation

## 2021-05-20 DIAGNOSIS — Z8719 Personal history of other diseases of the digestive system: Secondary | ICD-10-CM | POA: Insufficient documentation

## 2021-08-29 ENCOUNTER — Other Ambulatory Visit: Payer: Self-pay | Admitting: Nurse Practitioner

## 2021-08-29 DIAGNOSIS — Z01419 Encounter for gynecological examination (general) (routine) without abnormal findings: Secondary | ICD-10-CM

## 2021-09-03 ENCOUNTER — Ambulatory Visit: Payer: BC Managed Care – PPO | Admitting: Nurse Practitioner

## 2021-09-24 ENCOUNTER — Other Ambulatory Visit: Payer: BC Managed Care – PPO

## 2021-09-24 ENCOUNTER — Other Ambulatory Visit: Payer: Self-pay | Admitting: Family Medicine

## 2021-09-24 ENCOUNTER — Ambulatory Visit
Admission: RE | Admit: 2021-09-24 | Discharge: 2021-09-24 | Disposition: A | Discharge status: Home / Self Care | Payer: Medicare Other | Source: Ambulatory Visit | Attending: Family Medicine | Admitting: Family Medicine

## 2021-09-24 DIAGNOSIS — Z9889 Other specified postprocedural states: Secondary | ICD-10-CM

## 2021-10-10 ENCOUNTER — Ambulatory Visit (INDEPENDENT_AMBULATORY_CARE_PROVIDER_SITE_OTHER): Payer: Medicare Other | Admitting: Nurse Practitioner

## 2021-10-10 ENCOUNTER — Encounter: Payer: Self-pay | Admitting: Nurse Practitioner

## 2021-10-10 ENCOUNTER — Other Ambulatory Visit: Payer: Self-pay

## 2021-10-10 VITALS — BP 144/80 | Ht 60.0 in | Wt 190.0 lb

## 2021-10-10 DIAGNOSIS — Z01419 Encounter for gynecological examination (general) (routine) without abnormal findings: Secondary | ICD-10-CM | POA: Diagnosis not present

## 2021-10-10 DIAGNOSIS — Z9289 Personal history of other medical treatment: Secondary | ICD-10-CM

## 2021-10-10 DIAGNOSIS — Z853 Personal history of malignant neoplasm of breast: Secondary | ICD-10-CM

## 2021-10-10 DIAGNOSIS — Z780 Asymptomatic menopausal state: Secondary | ICD-10-CM

## 2021-10-10 NOTE — Progress Notes (Signed)
° °  Theresa Duncan 28-Jun-1956 [id number]   History:  66 y.o. G1P0010 presents for breast and pelvic exam. Postmenopausal - no HRT, no bleeding. Normal pap history. 2019 left breast cancer (LCIS) managed with lumpectomy, declined antiestrogen therapy. History of hypothyroidism, a fib. Husband had stroke July 2022, she has been main caregiver.   Gynecologic History Patient's last menstrual period was 02/27/2010.   Contraception: post menopausal status Sexually active: No  Health Maintenance Last Pap: 02/16/2018. Results were: Normal, 5-year repeat Last mammogram: 09/24/2021. Results were: Normal Last colonoscopy: 2018. Results were: Benign polyps, 3-5 year recall Last Dexa: Never. Scheduled 01/23/2022  Past medical history, past surgical history, family history and social history were all reviewed and documented in the EPIC chart. Married. 41, 3 granddaughters. Retired.   ROS:  A ROS was performed and pertinent positives and negatives are included.  Exam:  Vitals:   10/10/21 1142  BP: (!) 144/80  Weight: 190 lb (86.2 kg)  Height: 5' (1.524 m)   Body mass index is 37.11 kg/m.  General appearance:  Normal Thyroid:  Symmetrical, normal in size, without palpable masses or nodularity. Respiratory  Auscultation:  Clear without wheezing or rhonchi Cardiovascular  Auscultation:  Regular rate, without rubs, murmurs or gallops  Edema/varicosities:  Not grossly evident Abdominal  Soft,nontender, without masses, guarding or rebound.  Liver/spleen:  No organomegaly noted  Hernia:  None appreciated  Skin  Inspection:  Grossly normal Breasts: Examined lying and sitting.   Right: Without masses, retractions, nipple discharge or axillary adenopathy.   Left: Without masses, retractions, nipple discharge or axillary adenopathy. Genitourinary   Inguinal/mons:  Normal without inguinal adenopathy  External genitalia:  Normal appearing vulva with no masses, tenderness, or  lesions  BUS/Urethra/Skene's glands:  Normal  Vagina:  Normal appearing with normal color and discharge, no lesions. Atrophic changes  Cervix:  Normal appearing without discharge or lesions  Uterus:  Normal in size, shape and contour.  Midline and mobile, nontender  Adnexa/parametria:     Rt: Normal in size, without masses or tenderness.   Lt: Normal in size, without masses or tenderness.  Anus and perineum: Normal  Digital rectal exam: Normal sphincter tone without palpated masses or tenderness  Patient informed chaperone available to be present for breast and pelvic exam. Patient has requested no chaperone to be present. Patient has been advised what will be completed during breast and pelvic exam.   Assessment/Plan:  66 y.o. G1P0010 for breast and pelvic exam.   Well female exam with routine gynecological exam - Education provided on SBEs, importance of preventative screenings, current guidelines, high calcium diet, regular exercise, and multivitamin daily.  Labs with PCP.   Postmenopausal - no HRT, no bleeding  History of left breast cancer - 2019 left breast cancer (LCIS) managed with lumpectomy, declined antiestrogen therapy. Continue annual screenings. Normal breast exam today.   Screening for cervical cancer - Normal Pap history.  Will repeat at 5-year interval per guidelines.   Screening for colon cancer - 2018 colonoscopy. Will repeat at 3-5 year interval per GI's recommendation. She plans to call them soon to schedule.  Screening for osteoporosis - DXA scheduled in April 2023.  Return in 1 year for breast and pelvic exam.     Tamela Gammon DNP, 12:29 PM 10/10/2021

## 2022-01-23 ENCOUNTER — Other Ambulatory Visit: Payer: PRIVATE HEALTH INSURANCE

## 2022-03-19 NOTE — Progress Notes (Signed)
This nurse spoke with Md about patient having a lab appointment the same day as her phone visit.  MD gave permission to cancel lab appointment. Patient made aware.  No further questions or concerns.

## 2022-03-27 ENCOUNTER — Encounter: Payer: Self-pay | Admitting: Hematology

## 2022-03-27 ENCOUNTER — Other Ambulatory Visit: Payer: PRIVATE HEALTH INSURANCE

## 2022-03-27 ENCOUNTER — Inpatient Hospital Stay: Payer: MEDICARE | Attending: Hematology | Admitting: Hematology

## 2022-03-27 DIAGNOSIS — D0502 Lobular carcinoma in situ of left breast: Secondary | ICD-10-CM

## 2022-03-27 NOTE — Progress Notes (Addendum)
Theresa Duncan   Telephone:(336) [phone] Fax:(336) [phone]   Clinic Follow up Note   Patient Care Team: Robyne Peers, MD as PCP - General (Family Medicine) Stark Klein, MD as Consulting Physician (General Surgery) Truitt Merle, MD as Consulting Physician (Hematology)  Date of Service:  03/27/2022  I connected with Theresa Duncan on 03/27/2022 at  8:20 AM EDT by telephone visit and verified that I am speaking with the correct person using two identifiers.  I discussed the limitations, risks, security and privacy concerns of performing an evaluation and management service by telephone and the availability of in person appointments. I also discussed with the patient that there may be a patient responsible charge related to this service. The patient expressed understanding and agreed to proceed.   Other persons participating in the visit and their role in the encounter:  none  Patient's location:  home Provider's location:  my office  CHIEF COMPLAINT: f/u of LCIS  CURRENT THERAPY:  Surveillance  ASSESSMENT & PLAN:  Theresa Duncan is a 66 y.o. female with   1. Lobular Carcinoma in-situ of the Left Breast  -diagnosed in 09/2017. S/p left breast lumpectomy, no indication of invasive cancer.  -She declined chemoprevention with anti-estrogen therapy. She is on surveillance. -most recent mammogram 09/24/21 was benign. -I discussed with her the recent published TAM-01 trial data about the use of low-dose ('5mg'$ ) tamoxifen for 3 years as chemoprevention in high risk patients including LCIS, which reduced risk of breast cancer by 40%, including contralateral breast.  I encouraged her to think about and try it.  She would like to review the data and think about it.  She will call us if she is interested in trying. -I also reviewed the role of screening breast MRI, given her low breast density on mammogram, it is optional. She does not want to do at this time. She will continue breast  exam herself, as she may see Dr. Barry Dienes this year  -phone visit in 1 year per pt request. She will continue to f/u with her other physicians in interim.    2. Bone health -continue calcium and vitamin D -DEXA scheduled for 07/25/22   PLAN: -continue surveillance -phone visit in 1 year -She will review the low-dose tamoxifen data for breast cancer prevention, and call us if she wants to try tamoxifen 5 mg daily   No problem-specific Assessment & Plan notes found for this encounter.    SUMMARY OF ONCOLOGIC HISTORY: Oncology History Overview Note  Cancer Staging No matching staging information was found for the patient.      Lobular carcinoma in situ (LCIS) of left breast  09/15/2017 Mammogram   Mammogram 09/15/17 IMPRESSION: Suspicious distortion at 12 o'clock in the left breast.   RECOMMENDATION: Recommend stereotactic biopsy of the left breast distortion    10/02/2017 Initial Biopsy   Diagnosis 10/02/17 Breast, left, needle core biopsy, UOQ at anterior depth - MAMMARY CARCINOMA IN-SITU ARISING IN A COMPLEX SCLEROSING LESION - ATYPICAL LOBULAR HYPERPLASIA - CALCIFICATIONS - SEE COMMENT   11/18/2017 Surgery   LEFT BREAST LUMPECTOMY WITH RADIOACTIVE SEED LOCALIZATION by Dr Barry Dienes 11/18/17    11/18/2017 Pathology Results   Diagnosis 11/18/17 Breast, lumpectomy, Left - LOBULAR CARCINOMA IN-SITU ARISING IN A COMPLEX SCLEROSING LESION - INTRADUCTAL PAPILLOMA - PREVIOUS BIOPSY SITE CHANGES - CALCIFICATIONS - SEE COMMENT   12/18/2017 Initial Diagnosis   Lobular carcinoma in situ (LCIS) of left breast    Anti-estrogen oral therapy   She declined  antiestrogen therapy.       INTERVAL HISTORY:  Theresa Duncan was contacted for a follow up of LCIS. She is followed with annual phone visit. She reports she is doing well overall, no new concerns.    All other systems were reviewed with the patient and are negative.  MEDICAL HISTORY:  Past Medical History:  Diagnosis Date    Anxiety    chronic, panic attacks   Atrial fibrillation ([place]) 06/02/2010   treated with Cardiazem and Lovenox at [facility], now ASA   Cancer [facility])    breast   Colon polyp    tubular adenoma   Hypothyroidism    Migraine    Tachycardia 05/2010   Thyroid disease 05/2010   hypothyroid    SURGICAL HISTORY: Past Surgical History:  Procedure Laterality Date   APPENDECTOMY  1967   BREAST BIOPSY     BREAST LUMPECTOMY Left 10/2017   BREAST LUMPECTOMY WITH RADIOACTIVE SEED LOCALIZATION Left 11/18/2017   Procedure: LEFT BREAST LUMPECTOMY WITH RADIOACTIVE SEED LOCALIZATION;  Surgeon: Stark Klein, MD;  Location: [place];  Service: General;  Laterality: Left;   HERNIA REPAIR N/A    WISDOM TOOTH EXTRACTION      I have reviewed the social history and family history with the patient and they are unchanged from previous note.  ALLERGIES:  is allergic to penicillins and adhesive [tape].  MEDICATIONS:  Current Outpatient Medications  Medication Sig Dispense Refill   ALPRAZolam (XANAX) 0.25 MG tablet TAKE 1/2 TO 1 TABLET TWICE A DAY AS NEEDED     aspirin EC 81 MG tablet Take 81 mg by mouth daily.     Cholecalciferol (VITAMIN D3) 2000 UNITS TABS Take by mouth daily.     clindamycin (CLINDAGEL) 1 % gel Apply topically as needed.     diltiazem (CARDIZEM CD) 180 MG 24 hr capsule Take 180 mg by mouth daily.     flecainide (TAMBOCOR) 100 MG tablet Take 100 mg by mouth 2 (two) times daily.     Glucosamine Sulfate 500 MG TABS Take by mouth.     ibuprofen (ADVIL,MOTRIN) 200 MG tablet Take 200 mg by mouth every 6 (six) hours as needed for pain.     levothyroxine (SYNTHROID) 112 MCG tablet TAKE 1 TABLET BY MOUTH EVERY DAY     meclizine (ANTIVERT) 25 MG tablet Take 25 mg by mouth 3 (three) times daily as needed.     Multiple Vitamin (MULTIVITAMIN) tablet Take 1 tablet by mouth daily.     trolamine salicylate (ASPERCREME) 10 % cream Apply topically as needed.     TURMERIC PO Take  by mouth.     valACYclovir (VALTREX) 500 MG tablet Take 500 mg by mouth daily.     No current facility-administered medications for this visit.    PHYSICAL EXAMINATION: ECOG PERFORMANCE STATUS: 0 - Asymptomatic  There were no vitals filed for this visit. Wt Readings from Last 3 Encounters:  10/10/21 190 lb (86.2 kg)  08/31/20 207 lb (93.9 kg)  08/10/19 209 lb (94.8 kg)     No vitals taken today, Exam not performed today  LABORATORY DATA:  I have reviewed the data as listed    Latest Ref Rng & Units 01/12/2012   12:00 AM  CBC  WBC 10^3/mL 5.8      Hemoglobin 12.0 - 16.0 g/dL 13.9      Hematocrit 36 - 46 % 41         This result is from an  external source.        Latest Ref Rng & Units 01/12/2012   12:00 AM  CMP  BUN 4 - 21 mg/dL 12      Creatinine 0.5 - 1.1 mg/dL 0.7      Sodium 137 - 147 mmol/L 141      Potassium 3.4 - 5.3 mmol/L 4.9      AST 13 - 35 U/L 17      ALT 7 - 35 U/L 17         This result is from an external source.      RADIOGRAPHIC STUDIES: I have personally reviewed the radiological images as listed and agreed with the findings in the report. No results found.    No orders of the defined types were placed in this encounter.  All questions were answered. The patient knows to call the clinic with any problems, questions or concerns. No barriers to learning was detected. The total time spent in the appointment was 22 minutes.     Truitt Merle, MD 03/27/2022   I, Wilburn Mylar, am acting as scribe for Truitt Merle, MD.   I have reviewed the above documentation for accuracy and completeness, and I agree with the above.

## 2022-03-28 ENCOUNTER — Ambulatory Visit: Payer: PRIVATE HEALTH INSURANCE | Admitting: Hematology

## 2022-04-02 ENCOUNTER — Telehealth: Payer: Self-pay | Admitting: Nurse Practitioner

## 2022-04-02 NOTE — Telephone Encounter (Signed)
Left message with upcoming appointment per 6/29 los.

## 2022-07-25 ENCOUNTER — Other Ambulatory Visit: Payer: PRIVATE HEALTH INSURANCE

## 2022-08-29 ENCOUNTER — Other Ambulatory Visit: Payer: Self-pay | Admitting: Family Medicine

## 2022-08-29 DIAGNOSIS — Z1231 Encounter for screening mammogram for malignant neoplasm of breast: Secondary | ICD-10-CM

## 2022-10-08 ENCOUNTER — Ambulatory Visit: Payer: MEDICARE

## 2022-10-22 ENCOUNTER — Ambulatory Visit (INDEPENDENT_AMBULATORY_CARE_PROVIDER_SITE_OTHER): Payer: MEDICARE

## 2022-10-22 DIAGNOSIS — Z1231 Encounter for screening mammogram for malignant neoplasm of breast: Secondary | ICD-10-CM

## 2022-10-27 ENCOUNTER — Other Ambulatory Visit: Payer: Self-pay | Admitting: Family Medicine

## 2022-10-27 DIAGNOSIS — R928 Other abnormal and inconclusive findings on diagnostic imaging of breast: Secondary | ICD-10-CM

## 2022-10-29 ENCOUNTER — Encounter: Payer: Self-pay | Admitting: Family Medicine

## 2022-10-31 ENCOUNTER — Other Ambulatory Visit: Payer: Self-pay | Admitting: Family Medicine

## 2022-10-31 ENCOUNTER — Ambulatory Visit
Admission: RE | Admit: 2022-10-31 | Discharge: 2022-10-31 | Disposition: A | Discharge status: Home / Self Care | Payer: MEDICARE | Source: Ambulatory Visit | Attending: Family Medicine | Admitting: Family Medicine

## 2022-10-31 DIAGNOSIS — N6489 Other specified disorders of breast: Secondary | ICD-10-CM

## 2022-10-31 DIAGNOSIS — R928 Other abnormal and inconclusive findings on diagnostic imaging of breast: Secondary | ICD-10-CM

## 2022-11-28 ENCOUNTER — Ambulatory Visit
Admission: RE | Admit: 2022-11-28 | Discharge: 2022-11-28 | Disposition: A | Discharge status: Home / Self Care | Payer: MEDICARE | Source: Ambulatory Visit | Attending: Family Medicine | Admitting: Family Medicine

## 2022-11-28 DIAGNOSIS — N6489 Other specified disorders of breast: Secondary | ICD-10-CM

## 2022-11-28 HISTORY — PX: BREAST BIOPSY: SHX20

## 2022-12-01 ENCOUNTER — Telehealth: Payer: Self-pay | Admitting: Hematology

## 2022-12-01 NOTE — Telephone Encounter (Signed)
Contacted patient to scheduled appointments. Patient is aware of appointments that are scheduled.   

## 2022-12-08 ENCOUNTER — Other Ambulatory Visit: Payer: Self-pay | Admitting: General Surgery

## 2022-12-08 DIAGNOSIS — C50311 Malignant neoplasm of lower-inner quadrant of right female breast: Secondary | ICD-10-CM

## 2022-12-08 DIAGNOSIS — D126 Benign neoplasm of colon, unspecified: Secondary | ICD-10-CM | POA: Insufficient documentation

## 2022-12-08 DIAGNOSIS — F40240 Claustrophobia: Secondary | ICD-10-CM | POA: Insufficient documentation

## 2022-12-09 ENCOUNTER — Other Ambulatory Visit (HOSPITAL_COMMUNITY): Payer: Self-pay | Admitting: General Surgery

## 2022-12-09 DIAGNOSIS — C50311 Malignant neoplasm of lower-inner quadrant of right female breast: Secondary | ICD-10-CM

## 2022-12-10 ENCOUNTER — Other Ambulatory Visit: Payer: Self-pay

## 2022-12-10 DIAGNOSIS — D0502 Lobular carcinoma in situ of left breast: Secondary | ICD-10-CM

## 2022-12-10 NOTE — Progress Notes (Signed)
Patient Care Team: Theresa Peers, MD as PCP - General (Family Medicine) Theresa Klein, MD as Consulting Physician (General Surgery) Theresa Merle, MD as Consulting Physician (Hematology)  Date of Service: 12/11/2022  CHIEF COMPLAINT: Follow up newly diagnosed R breast cancer in the setting of h/o L breast LCIS  Oncology History Overview Note  Cancer Staging No matching staging information was found for the patient.      Lobular carcinoma in situ (LCIS) of left breast  09/15/2017 Mammogram   Mammogram 09/15/17 IMPRESSION: Suspicious distortion at 12 o'clock in the left breast.   RECOMMENDATION: Recommend stereotactic biopsy of the left breast distortion    10/02/2017 Initial Biopsy   Diagnosis 10/02/17 Breast, left, needle core biopsy, UOQ at anterior depth - MAMMARY CARCINOMA IN-SITU ARISING IN A COMPLEX SCLEROSING LESION - ATYPICAL LOBULAR HYPERPLASIA - CALCIFICATIONS - SEE COMMENT   11/18/2017 Surgery   LEFT BREAST LUMPECTOMY WITH RADIOACTIVE SEED LOCALIZATION by Theresa Duncan 11/18/17    11/18/2017 Pathology Results   Diagnosis 11/18/17 Breast, lumpectomy, Left - LOBULAR CARCINOMA IN-SITU ARISING IN A COMPLEX SCLEROSING LESION - INTRADUCTAL PAPILLOMA - PREVIOUS BIOPSY SITE CHANGES - CALCIFICATIONS - SEE COMMENT   12/18/2017 Initial Diagnosis   Lobular carcinoma in situ (LCIS) of left breast    Anti-estrogen oral therapy   She declined antiestrogen therapy.       CURRENT THERAPY: PENDING surgery and further treatment   INTERVAL HISTORY Theresa Duncan returns for follow up as scheduled. Last seen by Theresa Duncan 03/27/22 for surveillance visit for left breast LCIS. Routine mammogram 10/22/22 showed asymmetry with distortion and calcifications measuring 1.6 cm in the lower inner quadrant right breast; no adenopathy. Biopsy 11/28/22 showed invasive lobular carcinoma, grade 2, ER/PR + and HER2 negative by FISH. This was concordant. She met with Theresa Duncan who ordered breast MRI  (scheduled tomorrow) and is tentatively planning lumpectomy and SLNB. She is anxious but keeping positive. 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 Pt and spouse live in [place].  ROS  All other systems reviewed and negative   Past Medical History:  Diagnosis Date   Anxiety    chronic, panic attacks   Atrial fibrillation ([place]) 06/02/2010   treated with Cardiazem and Lovenox at  [facility], now ASA   Cancer [facility])    breast   Colon polyp    tubular adenoma   Hypothyroidism    Migraine    Tachycardia 05/2010   Thyroid disease 05/2010   hypothyroid     Past Surgical History:  Procedure Laterality Date   APPENDECTOMY  1967   BREAST BIOPSY     BREAST BIOPSY Right 11/28/2022   MM RT BREAST BX W LOC DEV 1ST LESION IMAGE BX SPEC STEREO GUIDE 11/28/2022 [facility] MAMMOGRAP[facility]   BREAST LUMPECTOMY Left 10/2017   BREAST LUMPECTOMY WITH RADIOACTIVE SEED LOCALIZATION Left 11/18/2017   Procedure: LEFT BREAST LUMPECTOMY WITH RADIOACTIVE SEED LOCALIZATION;  Surgeon: Theresa Klein, MD;  Location: [facility];  Service: General;  Laterality: Left;   HERNIA REPAIR N/A    WISDOM TOOTH EXTRACTION       Outpatient Encounter Medications as of 12/11/2022  Medication Sig   ALPRAZolam (XANAX) 0.25 MG tablet TAKE 1/2 TO 1 TABLET TWICE A DAY AS NEEDED   aspirin EC 81 MG tablet Take 81 mg by mouth daily.   Cholecalciferol (VITAMIN D3) 2000 UNITS TABS Take by mouth daily.   clindamycin (CLINDAGEL) 1 % gel  Apply topically as needed.   diltiazem (CARDIZEM CD) 180 MG 24 hr capsule Take 180 mg by mouth daily.   flecainide (TAMBOCOR) 100 MG tablet Take 100 mg by mouth 2 (two) times daily. 100 mg in the am and 50 mg at bedtime   Glucosamine Sulfate 500 MG TABS Take by mouth.   ibuprofen (ADVIL,MOTRIN) 200 MG tablet Take 200 mg by mouth every 6 (six) hours as needed for pain.   levothyroxine (SYNTHROID) 112 MCG tablet TAKE 1 TABLET BY MOUTH EVERY DAY    losartan (COZAAR) 25 MG tablet Take by mouth.   meclizine (ANTIVERT) 25 MG tablet Take 25 mg by mouth 3 (three) times daily as needed.   Multiple Vitamin (MULTIVITAMIN) tablet Take 1 tablet by mouth daily.   rosuvastatin (CRESTOR) 5 MG tablet Take by mouth.   trolamine salicylate (ASPERCREME) 10 % cream Apply topically as needed.   valACYclovir (VALTREX) 500 MG tablet Take 500 mg by mouth daily.   TURMERIC PO Take by mouth.   No facility-administered encounter medications on file as of 12/11/2022.     Today's Vitals   12/11/22 0858  BP: (!) 147/72  Pulse: 63  Resp: 16  Temp: 98.3 F (36.8 C)  TempSrc: Oral  SpO2: 98%  Weight: 205 lb 11.2 oz (93.3 kg)   Body mass index is 40.17 kg/m.   PHYSICAL EXAM GENERAL:alert, no distress and comfortable SKIN: no rash  EYES: sclera clear NECK: without mass LYMPH:  no palpable cervical or supraclavicular lymphadenopathy  LUNGS: normal breathing effort HEART: no lower extremity edema ABDOMEN: abdomen soft, non-tender and normal bowel sounds NEURO: alert & oriented x 3 with fluent speech, no focal motor/sensory deficits Breast exam: No nipple discharge or inversion.  S/p left lumpectomy and recent right breast biopsy with healing ecchymosis.  Previous incisions completely healed.  No palpable mass in either breast or axilla that I could appreciate.   CBC    Component Value Date/Time   WBC 7.6 12/11/2022 0844   RBC 4.48 12/11/2022 0844   HGB 14.1 12/11/2022 0844   HCT 41.0 12/11/2022 0844   PLT 245 12/11/2022 0844   MCV 91.5 12/11/2022 0844   MCH 31.5 12/11/2022 0844   MCHC 34.4 12/11/2022 0844   RDW 13.0 12/11/2022 0844   LYMPHSABS 2.6 12/11/2022 0844   MONOABS 0.7 12/11/2022 0844   EOSABS 0.2 12/11/2022 0844   BASOSABS 0.1 12/11/2022 0844     CMP     Component Value Date/Time   NA 142 12/11/2022 0844   NA 141 01/12/2012 0000   K 4.6 12/11/2022 0844   CL 105 12/11/2022 0844   CO2 31 12/11/2022 0844   GLUCOSE 98  12/11/2022 0844   BUN 13 12/11/2022 0844   BUN 12 01/12/2012 0000   CREATININE 0.76 12/11/2022 0844   CALCIUM 9.7 12/11/2022 0844   PROT 7.9 12/11/2022 0844   ALBUMIN 4.5 12/11/2022 0844   AST 16 12/11/2022 0844   ALT 17 12/11/2022 0844   ALKPHOS 58 12/11/2022 0844   BILITOT 0.6 12/11/2022 0844   GFRNONAA >60 12/11/2022 0844     ASSESSMENT & PLAN:Theresa Duncan is a 67 y.o. female with    Invasive lobular carcinoma and LCIS, G2, ER/PR + HER2 - Ki67 5%  -Screening detected R breast distortion and asymmetry spanning 1.6 cm in the lower inner quadrant, biopsy 11/28/22 revealed invasive lobular carcinoma and intermediate grade LCIS.  Lymphovascular invasion was not identified.  ER 100% strongly positive, PR 90% strongly positive  with a Ki-67 of 5%, HER2 negative by FISH -At the time we met she had seen Theresa Duncan and desired breast conservation, so lympectomy and SLNB was planned however MRI had not been done.  -We discussed oncotype testing if tumor is > 1 cm and < 4 positive LNs to stratify her recurrence risk and determine chemo benefit.  -She is not very interested in chemo, and would be challenging due to her husband's health issues but she would like to know her risk to make an informed decision. Will order if it meets parameters -Since her visit with [place], she underwent breast MRI and biopsies 12/24/22 which showed additional sites of right breast LCIS and left breast multifocal ER/PR + DCIS. These were concordant.  -Further surgical planning per pt and Theresa Duncan. We will see her after surgery to review final path -If she proceeds with lumpectomy, she would benefit from adjuvant radiation -Given ER/PR +, she would benefit from anti-estrogen therapy after radiation. She is open to this.   2. Lobular Carcinoma in-situ of the Left Breast  -Diagnosed in 09/2017. S/p left breast lumpectomy. She had complete resection and no indication of invasive cancer.  -She declined chemoprevention with  anti-estrogen therapy. -She has been on surveillance with annual mammograms. Additional screening breast MRI has been recommended, she continues to decline due to claustrophobia.    3. Bone health -continue calcium and vitamin D -DEXA per PCP   4. Cancer Screening -Colonoscopy due due this year has been postponed to April, following breast surgery   PLAN: -Imaging and path reviewed -Surgery per Theresa Duncan, will order oncotype if invasive tumor is > 1 cm and less than 4 positive nodes -F/up after surgery if oncotype is high risk, or after radiation -Patient seen with Theresa Duncan    All questions were answered. The patient knows to call the clinic with any problems, questions or concerns. No barriers to learning were detected.    Cira Rue, NP-C 12/30/2022   Addendum  I have seen the patient, examined her. I agree with the assessment and and plan and have edited the notes.   Latece was recently found to have early stage right invasive lobular carcinoma, which is strongly ER and PR positive, HER2 negative.  This was found on screening mammogram.  She is scheduled to have breast MRI for further evaluation due to lobular histology.  She has seen breast surgeon Theresa Duncan, and will proceed with right lumpectomy and sentinel lymph node biopsy.  I discussed the risk of recurrence, and recommend Oncotype Dx on her surgical sample for risk stratification, if her tumor is more than 1cm, and less than 4 positive nodes.  Given the lobular histology, strong ER/PR positive and HER2 negative disease, this is likely low risk disease.  I strongly recommended her to take antiestrogen therapy after radiation, benefit and side effects discussed with her in detail.  She is interested now.  All questions were answered. Will see her back at the end of adjuvant radiation, or sooner if Oncotype shows high risk disease.  Theresa Merle MD 12/11/2022

## 2022-12-11 ENCOUNTER — Inpatient Hospital Stay (HOSPITAL_BASED_OUTPATIENT_CLINIC_OR_DEPARTMENT_OTHER): Payer: MEDICARE | Admitting: Nurse Practitioner

## 2022-12-11 ENCOUNTER — Other Ambulatory Visit: Payer: Self-pay

## 2022-12-11 ENCOUNTER — Encounter: Payer: Self-pay | Admitting: Nurse Practitioner

## 2022-12-11 ENCOUNTER — Inpatient Hospital Stay: Payer: MEDICARE | Attending: Nurse Practitioner

## 2022-12-11 VITALS — BP 147/72 | HR 63 | Temp 98.3°F | Resp 16 | Wt 205.7 lb

## 2022-12-11 DIAGNOSIS — Z170 Estrogen receptor positive status [ER+]: Secondary | ICD-10-CM | POA: Insufficient documentation

## 2022-12-11 DIAGNOSIS — D0501 Lobular carcinoma in situ of right breast: Secondary | ICD-10-CM | POA: Diagnosis not present

## 2022-12-11 DIAGNOSIS — C50311 Malignant neoplasm of lower-inner quadrant of right female breast: Secondary | ICD-10-CM | POA: Diagnosis not present

## 2022-12-11 DIAGNOSIS — D0502 Lobular carcinoma in situ of left breast: Secondary | ICD-10-CM

## 2022-12-11 LAB — CMP (CANCER CENTER ONLY)
ALT: 17 U/L (ref 0–44)
AST: 16 U/L (ref 15–41)
Albumin: 4.5 g/dL (ref 3.5–5.0)
Alkaline Phosphatase: 58 U/L (ref 38–126)
Anion gap: 6 (ref 5–15)
BUN: 13 mg/dL (ref 8–23)
CO2: 31 mmol/L (ref 22–32)
Calcium: 9.7 mg/dL (ref 8.9–10.3)
Chloride: 105 mmol/L (ref 98–111)
Creatinine: 0.76 mg/dL (ref 0.44–1.00)
GFR, Estimated: >60 mL/min (ref >60)
Glucose, Bld: 98 mg/dL (ref 70–99)
Potassium: 4.6 mmol/L (ref 3.5–5.1)
Sodium: 142 mmol/L (ref 135–145)
Total Bilirubin: 0.6 mg/dL (ref 0.3–1.2)
Total Protein: 7.9 g/dL (ref 6.5–8.1)

## 2022-12-11 LAB — CBC WITH DIFFERENTIAL (CANCER CENTER ONLY)
Abs Immature Granulocytes: 0.02 10*3/uL (ref 0.00–0.07)
Basophils Absolute: 0.1 10*3/uL (ref 0.0–0.1)
Basophils Relative: 1 %
Eosinophils Absolute: 0.2 10*3/uL (ref 0.0–0.5)
Eosinophils Relative: 2 %
HCT: 41 % (ref 36.0–46.0)
Hemoglobin: 14.1 g/dL (ref 12.0–15.0)
Immature Granulocytes: 0 %
Lymphocytes Relative: 34 %
Lymphs Abs: 2.6 10*3/uL (ref 0.7–4.0)
MCH: 31.5 pg (ref 26.0–34.0)
MCHC: 34.4 g/dL (ref 30.0–36.0)
MCV: 91.5 fL (ref 80.0–100.0)
Monocytes Absolute: 0.7 10*3/uL (ref 0.1–1.0)
Monocytes Relative: 10 %
Neutro Abs: 4 10*3/uL (ref 1.7–7.7)
Neutrophils Relative %: 53 %
Platelet Count: 245 10*3/uL (ref 150–400)
RBC: 4.48 MIL/uL (ref 3.87–5.11)
RDW: 13 % (ref 11.5–15.5)
WBC Count: 7.6 10*3/uL (ref 4.0–10.5)
nRBC: 0 % (ref 0.0–0.2)

## 2022-12-12 ENCOUNTER — Encounter: Payer: Self-pay | Admitting: *Deleted

## 2022-12-12 ENCOUNTER — Ambulatory Visit (HOSPITAL_COMMUNITY)
Admission: RE | Admit: 2022-12-12 | Discharge: 2022-12-12 | Disposition: A | Discharge status: Home / Self Care | Payer: MEDICARE | Source: Ambulatory Visit | Attending: General Surgery | Admitting: General Surgery

## 2022-12-12 ENCOUNTER — Other Ambulatory Visit: Payer: Self-pay | Admitting: General Surgery

## 2022-12-12 DIAGNOSIS — C50311 Malignant neoplasm of lower-inner quadrant of right female breast: Secondary | ICD-10-CM | POA: Insufficient documentation

## 2022-12-12 DIAGNOSIS — Z170 Estrogen receptor positive status [ER+]: Secondary | ICD-10-CM

## 2022-12-12 MED ORDER — GADOBUTROL 1 MMOL/ML IV SOLN
9.0000 mL | Freq: Once | INTRAVENOUS | Status: AC | PRN
  Administered 2022-12-12: 9 mL via INTRAVENOUS

## 2022-12-16 ENCOUNTER — Other Ambulatory Visit: Payer: Self-pay | Admitting: General Surgery

## 2022-12-16 ENCOUNTER — Encounter: Payer: Self-pay | Admitting: General Surgery

## 2022-12-16 ENCOUNTER — Encounter (HOSPITAL_COMMUNITY): Payer: Self-pay | Admitting: General Surgery

## 2022-12-16 DIAGNOSIS — R9389 Abnormal findings on diagnostic imaging of other specified body structures: Secondary | ICD-10-CM

## 2022-12-21 NOTE — Therapy (Signed)
OUTPATIENT PHYSICAL THERAPY BREAST CANCER BASELINE EVALUATION   Patient Name: Theresa Duncan MRN: [id number] DOB:1956/01/19, 67 y.o., female Today's Date: 12/22/2022  END OF SESSION:  PT End of Session - 12/22/22 0945     Visit Number 1    Number of Visits 2    Date for PT Re-Evaluation 03/16/23    PT Start Time 0845    PT Stop Time 0920    PT Time Calculation (min) 35 min    Activity Tolerance Patient tolerated treatment well    Behavior During Therapy [facility] for tasks assessed/performed             Past Medical History:  Diagnosis Date   Anxiety    chronic, panic attacks   Atrial fibrillation ([place]) 06/02/2010   treated with Cardiazem and Lovenox at [facility], now ASA   Cancer ([place])    breast   Colon polyp    tubular adenoma   Hypothyroidism    Migraine    Tachycardia 05/2010   Thyroid disease 05/2010   hypothyroid   Past Surgical History:  Procedure Laterality Date   APPENDECTOMY  1967   BREAST BIOPSY     BREAST BIOPSY Right 11/28/2022   MM RT BREAST BX W LOC DEV 1ST LESION IMAGE BX SPEC STEREO GUIDE 11/28/2022 [facility]AMMOGRAP[facility]   BREAST LUMPECTOMY Left 10/2017   BREAST LUMPECTOMY WITH RADIOACTIVE SEED LOCALIZATION Left 11/18/2017   Procedure: LEFT BREAST LUMPECTOMY WITH RADIOACTIVE SEED LOCALIZATION;  Surgeon: Stark Klein, MD;  Location: [place];  Service: General;  Laterality: Left;   HERNIA REPAIR N/A    WISDOM TOOTH EXTRACTION     Patient Active Problem List   Diagnosis Date Noted   Malignant neoplasm of lower-inner quadrant of right breast of female, estrogen receptor positive ([place]) 12/12/2022   Lobular carcinoma in situ (LCIS) of left breast 12/18/2017   Situational anxiety 10/15/2017   Lobular carcinoma of left breast ([place]) 10/13/2017   Palpitations 10/01/2016   Hair loss 07/15/2016   Seborrheic keratoses 07/15/2016   Hyperlipidemia 12/20/2014   Atrial fibrillation ([place]) 05/16/2014   Essential hypertension 05/16/2014   PVCs  (premature ventricular contractions) 05/16/2014   Preventative health care 05/16/2014   Chronic anxiety 07/05/2013   Other specified hypothyroidism 07/05/2013   Morbid obesity ([place]) 07/05/2013   Unspecified vitamin D deficiency 07/05/2013    PCP: Dr. Lorayne Marek  REFERRING PROVIDER: Dr. Barry Dienes  REFERRING DIAG:  Diagnosis  C50.311,Z17.0 (ICD-10-CM) - Malignant neoplasm of lower-inner quadrant of right breast of female, estrogen receptor positive ([place]    THERAPY DIAG:  Malignant neoplasm of lower-inner quadrant of right breast of female, estrogen receptor positive ([place])  History of left breast cancer  Abnormal posture  Rationale for Evaluation and Treatment: Rehabilitation  ONSET DATE: 11/28/22  SUBJECTIVE:  SUBJECTIVE STATEMENT: Patient reports she is here today to be seen by her medical team for her newly diagnosed right breast cancer but I have to see if there are things on the other side with the new biopsy.    PERTINENT HISTORY:  Hx of Lt LCIS in 2018 with lumpectomy and no nodes removed and now new Rt LCIS grade 2 ER/PR positive and HER2 negative. Breast MRI on 12/12/22 noted the need for 3 more biopsies so surgery is not yet scheduled.   PATIENT GOALS:   reduce lymphedema risk and learn post op HEP.   PAIN:  Are you having pain? No  PRECAUTIONS: Active CA  HAND DOMINANCE: right  WEIGHT BEARING RESTRICTIONS: No  FALLS:  Has patient fallen in last 6 months? 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   LEISURE: walking, nothing else   PRIOR LEVEL OF FUNCTION: Independent   OBJECTIVE:  COGNITION: Overall cognitive status: Within functional limits for tasks assessed    POSTURE:   Forward head and rounded shoulders posture  UPPER EXTREMITY AROM/PROM:  A/PROM RIGHT   eval   Shoulder extension 50  Shoulder flexion 165  Shoulder abduction 165  Shoulder internal rotation   Shoulder external rotation 80    (Blank rows = not tested)  A/PROM LEFT   eval  Shoulder extension 40  Shoulder flexion 155  Shoulder abduction 162  Shoulder internal rotation   Shoulder external rotation 80    (Blank rows = not tested)  LYMPHEDEMA ASSESSMENTS:   LANDMARK RIGHT   eval  10 cm proximal to olecranon process 37.4  Olecranon process 29.5  10 cm proximal to ulnar styloid process 24.8  Just proximal to ulnar styloid process 16.7  Across hand at thumb web space 18.6  At base of 2nd digit 6.8  (Blank rows = not tested)  LANDMARK LEFT   eval  10 cm proximal to olecranon process 38.5  Olecranon process 32.0  10 cm proximal to ulnar styloid process 24.3  Just proximal to ulnar styloid process 16.5  Across hand at thumb web space 18.7  At base of 2nd digit 6.5  (Blank rows = not tested)  L-DEX LYMPHEDEMA SCREENING: The patient was assessed using the L-Dex machine today to produce a lymphedema index baseline score. The patient will be reassessed on a regular basis (typically every 3 months) to obtain new L-Dex scores. If the score is > 6.5 points away from his/her baseline score indicating onset of subclinical lymphedema, it will be recommended to wear a compression garment for 4 weeks, 12 hours per day and then be reassessed. If the score continues to be > 6.5 points from baseline at reassessment, we will initiate lymphedema treatment. Assessing in this manner has a 95% rate of preventing clinically significant lymphedema.  PATIENT EDUCATION:  Education details: Lymphedema risk reduction and post op shoulder/posture HEP Person educated: Patient Education method: Explanation, Demonstration, Handout Education comprehension: Patient verbalized understanding and returned  demonstration  HOME EXERCISE PROGRAM: Patient was instructed today in a home exercise program today for post op shoulder range of motion. These included active assist shoulder flexion in sitting, scapular retraction, wall walking with shoulder abduction, and hands behind head external rotation.  She was encouraged to do these twice a day, holding 3 seconds and repeating 5 times when permitted by her physician.   ASSESSMENT:  CLINICAL IMPRESSION: Surgical procedure and status is unknown  at this time. Pt will benefit from a post op PT reassessment to determine needs and from L-Dex screens every 3 months for 2 years to detect subclinical lymphedema.  Pt will benefit from skilled therapeutic intervention to improve on the following deficits: Decreased knowledge of precautions, impaired UE functional use, pain, decreased ROM, postural dysfunction.   PT treatment/interventions: ADL/self-care home management, pt/family education, therapeutic exercise  REHAB POTENTIAL: Excellent  CLINICAL DECISION MAKING: Stable/uncomplicated  EVALUATION COMPLEXITY: Low   GOALS: Goals reviewed with patient? YES  LONG TERM GOALS: (STG=LTG)    Name Target Date Goal status  1 Pt will be able to verbalize understanding of pertinent lymphedema risk reduction practices relevant to her dx specifically related to skin care.  Baseline:  No knowledge 12/22/2022 Achieved at eval  2 Pt will be able to return demo and/or verbalize understanding of the post op HEP related to regaining shoulder ROM. Baseline:  No knowledge 12/22/2022 Achieved at eval  3 Pt will be able to verbalize understanding of the importance of attending the post op After Breast CA Class for further lymphedema risk reduction education and therapeutic exercise.  Baseline:  No knowledge 12/22/2022 Achieved at eval  4 Pt will demo she has regained full shoulder ROM and function post operatively compared to baselines.  Baseline: See objective measurements  taken today. 03/16/23     PLAN:  PT FREQUENCY/DURATION: EVAL and 1 follow up appointment.   PLAN FOR NEXT SESSION: will reassess 3-4 weeks post op to determine needs.   Patient will follow up at outpatient cancer rehab 3-4 weeks following surgery.  If the patient requires physical therapy at that time, a specific plan will be dictated and sent to the referring physician for approval. The patient was educated today on appropriate basic range of motion exercises to begin post operatively and the importance of attending the After Breast Cancer class following surgery.  Patient was educated today on lymphedema risk reduction practices as it pertains to recommendations that will benefit the patient immediately following surgery.  She verbalized good understanding.    Physical Therapy Information for After Breast Cancer Surgery/Treatment:  Lymphedema is a swelling condition that you may be at risk for in your arm if you have lymph nodes removed from the armpit area.  After a sentinel node biopsy, the risk is approximately 5-9% and is higher after an axillary node dissection.  There is treatment available for this condition and it is not life-threatening.  Contact your physician or physical therapist with concerns. You may begin the 4 shoulder/posture exercises (see additional sheet) when permitted by your physician (typically a week after surgery).  If you have drains, you may need to wait until those are removed before beginning range of motion exercises.  A general recommendation is to not lift your arms above shoulder height until drains are removed.  These exercises should be done to your tolerance and gently.  This is not a "no pain/no gain" type of recovery so listen to your body and stretch into the range of motion that you can tolerate, stopping if you have pain.  If you are having immediate reconstruction, ask your plastic surgeon about doing exercises as he or she may want you to wait. We  encourage you to attend the free one time ABC (After Breast Cancer) class offered by [facility].  You will learn information related to lymphedema risk, prevention and treatment and additional exercises to regain mobility following surgery.  You can call  [phone] for more information.  This is offered the 1st and 3rd Monday of each month.  You only attend the class one time. While undergoing any medical procedure or treatment, try to avoid blood pressure being taken or needle sticks from occurring on the arm on the side of cancer.   This recommendation begins after surgery and continues for the rest of your life.  This may help reduce your risk of getting lymphedema (swelling in your arm). An excellent resource for those seeking information on lymphedema is the National Lymphedema Network's web site. It can be accessed at [place].org If you notice swelling in your hand, arm or breast at any time following surgery (even if it is many years from now), please contact your doctor or physical therapist to discuss this.  Lymphedema can be treated at any time but it is easier for you if it is treated early on.  If you feel like your shoulder motion is not returning to normal in a reasonable amount of time, please contact your surgeon or physical therapist.  [facility] [phone]. [address], [place]  [address]  ABC CLASS After Breast Cancer Class  After Breast Cancer Class is a specially designed exercise class to assist you in a safe recover after having breast cancer surgery.  In this class you will learn how to get back to full function whether your drains were just removed or if you had surgery a month ago.  This one-time class is held the 1st and 3rd Monday of every month from 11:00 a.m. until 12:00 noon virtually.  This class is FREE and space is limited. For more information or to register for the next available  class, call [phone].  Class Goals  Understand specific stretches to improve the flexibility of you chest and shoulder. Learn ways to safely strengthen your upper body and improve your posture. Understand the warning signs of infection and why you may be at risk for an arm infection. Learn about Lymphedema and prevention.  ** You do not attend this class until after surgery.  Drains must be removed to participate  Patient was instructed today in a home exercise program today for post op shoulder range of motion. These included active assist shoulder flexion in sitting, scapular retraction, wall walking with shoulder abduction, and hands behind head external rotation.  She was encouraged to do these twice a day, holding 3 seconds and repeating 5 times when permitted by her physician.    Stark Bray, PT 12/22/2022, 9:46 AM

## 2022-12-22 ENCOUNTER — Encounter: Payer: Self-pay | Admitting: Rehabilitation

## 2022-12-22 ENCOUNTER — Ambulatory Visit: Payer: MEDICARE | Attending: General Surgery | Admitting: Rehabilitation

## 2022-12-22 ENCOUNTER — Other Ambulatory Visit: Payer: Self-pay

## 2022-12-22 DIAGNOSIS — Z170 Estrogen receptor positive status [ER+]: Secondary | ICD-10-CM | POA: Insufficient documentation

## 2022-12-22 DIAGNOSIS — Z5189 Encounter for other specified aftercare: Secondary | ICD-10-CM | POA: Insufficient documentation

## 2022-12-22 DIAGNOSIS — R293 Abnormal posture: Secondary | ICD-10-CM | POA: Diagnosis not present

## 2022-12-22 DIAGNOSIS — Z853 Personal history of malignant neoplasm of breast: Secondary | ICD-10-CM | POA: Diagnosis not present

## 2022-12-22 DIAGNOSIS — C50311 Malignant neoplasm of lower-inner quadrant of right female breast: Secondary | ICD-10-CM | POA: Insufficient documentation

## 2022-12-24 ENCOUNTER — Ambulatory Visit
Admission: RE | Admit: 2022-12-24 | Discharge: 2022-12-24 | Disposition: A | Discharge status: Home / Self Care | Payer: MEDICARE | Source: Ambulatory Visit | Attending: General Surgery | Admitting: General Surgery

## 2022-12-24 DIAGNOSIS — R9389 Abnormal findings on diagnostic imaging of other specified body structures: Secondary | ICD-10-CM

## 2022-12-24 MED ORDER — GADOPICLENOL 0.5 MMOL/ML IV SOLN: 9.0000 mL | Freq: Once | INTRAVENOUS | Status: DC | PRN

## 2022-12-25 ENCOUNTER — Ambulatory Visit (HOSPITAL_BASED_OUTPATIENT_CLINIC_OR_DEPARTMENT_OTHER): Admission: RE | Admit: 2022-12-25 | Payer: MEDICARE | Source: Home / Self Care | Admitting: General Surgery

## 2022-12-25 ENCOUNTER — Encounter (HOSPITAL_BASED_OUTPATIENT_CLINIC_OR_DEPARTMENT_OTHER): Admission: RE | Payer: Self-pay | Source: Home / Self Care

## 2022-12-25 SURGERY — BREAST LUMPECTOMY WITH RADIOACTIVE SEED AND SENTINEL LYMPH NODE BIOPSY
Anesthesia: General | Site: Breast | Laterality: Right

## 2022-12-30 ENCOUNTER — Encounter: Payer: Self-pay | Admitting: Nurse Practitioner

## 2023-01-01 ENCOUNTER — Encounter: Payer: Self-pay | Admitting: *Deleted

## 2023-01-05 ENCOUNTER — Other Ambulatory Visit: Payer: Self-pay | Admitting: General Surgery

## 2023-01-05 DIAGNOSIS — Z170 Estrogen receptor positive status [ER+]: Secondary | ICD-10-CM

## 2023-01-06 ENCOUNTER — Encounter: Payer: Self-pay | Admitting: *Deleted

## 2023-01-07 ENCOUNTER — Other Ambulatory Visit: Payer: Self-pay | Admitting: General Surgery

## 2023-01-07 DIAGNOSIS — C50311 Malignant neoplasm of lower-inner quadrant of right female breast: Secondary | ICD-10-CM

## 2023-01-13 NOTE — Pre-Procedure Instructions (Signed)
Surgical Instructions    Your procedure is scheduled on Thursday, April 25th.  Report to [facility] Main Entrance "A" at 9:30 A.M., then check in with the Admitting office.  Call this number if you have problems the morning of surgery:  [phone]  If you have any questions prior to your surgery date call [phone]: Open Monday-Friday 8am-4pm If you experience any cold or flu symptoms such as cough, fever, chills, shortness of breath, etc. between now and your scheduled surgery, please notify us at the above number.     Remember:  Do not eat after midnight the night before your surgery  You may drink clear liquids until 8:30 a.m. the morning of your surgery.   Clear liquids allowed are: Water, Non-Citrus Juices (without pulp), Carbonated Beverages, Clear Tea, Black Coffee Only (NO MILK, CREAM OR POWDERED CREAMER of any kind), and Gatorade.    Take these medicines the morning of surgery with A SIP OF WATER  diltiazem (CARDIZEM CD)  flecainide (TAMBOCOR)  levothyroxine (SYNTHROID)  rosuvastatin (CRESTOR)    Take these medications as needed: acetaminophen (TYLENOL)  ALPRAZolam (XANAX)  meclizine (ANTIVERT)  valACYclovir (VALTREX)   Follow your surgeon's instructions on when to stop Aspirin.  If no instructions were given by your surgeon then you will need to call the office to get those instructions.    As of today, STOP taking any Aleve, Naproxen, Ibuprofen, Motrin, Advil, Goody's, BC's, all herbal medications, fish oil, and all vitamins.                     Do NOT Smoke (Tobacco/Vaping) for 24 hours prior to your procedure.  If you use a CPAP at night, you may bring your mask/headgear for your overnight stay.   Contacts, glasses, piercing's, hearing aid's, dentures or partials may not be worn into surgery, please bring cases for these belongings.    For patients admitted to the hospital, discharge time will be determined by your treatment team.   Patients discharged the  day of surgery will not be allowed to drive home, and someone needs to stay with them for 24 hours.  SURGICAL WAITING ROOM VISITATION Patients having surgery or a procedure may have no more than 2 support people in the waiting area - these visitors may rotate.   Children under the age of 60 must have an adult with them who is not the patient. If the patient needs to stay at the hospital during part of their recovery, the visitor guidelines for inpatient rooms apply. Pre-op nurse will coordinate an appropriate time for 1 support person to accompany patient in pre-op.  This support person may not rotate.   Please refer to the [facility] website for the visitor guidelines for Inpatients (after your surgery is over and you are in a regular room).    Special instructions:   - Preparing For Surgery  Before surgery, you can play an important role. Because skin is not sterile, your skin needs to be as free of germs as possible. You can reduce the number of germs on your skin by washing with CHG (chlorahexidine gluconate) Soap before surgery.  CHG is an antiseptic cleaner which kills germs and bonds with the skin to continue killing germs even after washing.    Oral Hygiene is also important to reduce your risk of infection.  Remember - BRUSH YOUR TEETH THE MORNING OF SURGERY WITH YOUR REGULAR TOOTHPASTE  Please do not use if you have an allergy to  CHG or antibacterial soaps. If your skin becomes reddened/irritated stop using the CHG.  Do not shave (including legs and underarms) for at least 48 hours prior to first CHG shower. It is OK to shave your face.  Please follow these instructions carefully.   Shower the NIGHT BEFORE SURGERY and the MORNING OF SURGERY  If you chose to wash your hair, wash your hair first as usual with your normal shampoo.  After you shampoo, rinse your hair and body thoroughly to remove the shampoo.  Use CHG Soap as you would any other liquid soap. You can  apply CHG directly to the skin and wash gently with a scrungie or a clean washcloth.   Apply the CHG Soap to your body ONLY FROM THE NECK DOWN.  Do not use on open wounds or open sores. Avoid contact with your eyes, ears, mouth and genitals (private parts). Wash Face and genitals (private parts)  with your normal soap.   Wash thoroughly, paying special attention to the area where your surgery will be performed.  Thoroughly rinse your body with warm water from the neck down.  DO NOT shower/wash with your normal soap after using and rinsing off the CHG Soap.  Pat yourself dry with a CLEAN TOWEL.  Wear CLEAN PAJAMAS to bed the night before surgery  Place CLEAN SHEETS on your bed the night before your surgery  DO NOT SLEEP WITH PETS.   Day of Surgery: Take a shower with CHG soap. Do not wear jewelry or makeup Do not wear lotions, powders, perfumes, or deodorant. Do not shave 48 hours prior to surgery.   Do not bring valuables to the hospital.  [facility] is not responsible for any belongings or valuables. Do not wear nail polish, gel polish, artificial nails, or any other type of covering on natural nails (fingers and toes) If you have artificial nails or gel coating that need to be removed by a nail salon, please have this removed prior to surgery. Artificial nails or gel coating may interfere with anesthesia's ability to adequately monitor your vital signs. Wear Clean/Comfortable clothing the morning of surgery Remember to brush your teeth WITH YOUR REGULAR TOOTHPASTE.   Please read over the following fact sheets that you were given.    If you received a COVID test during your pre-op visit  it is requested that you wear a mask when out in public, stay away from anyone that may not be feeling well and notify your surgeon if you develop symptoms. If you have been in contact with anyone that has tested positive in the last 10 days please notify you surgeon.

## 2023-01-14 ENCOUNTER — Other Ambulatory Visit: Payer: Self-pay

## 2023-01-14 ENCOUNTER — Encounter (HOSPITAL_COMMUNITY)
Admission: RE | Admit: 2023-01-14 | Discharge: 2023-01-14 | Disposition: A | Discharge status: Home / Self Care | Payer: MEDICARE | Source: Ambulatory Visit | Attending: General Surgery | Admitting: General Surgery

## 2023-01-14 ENCOUNTER — Encounter (HOSPITAL_COMMUNITY): Payer: Self-pay

## 2023-01-14 ENCOUNTER — Other Ambulatory Visit (HOSPITAL_COMMUNITY): Payer: MEDICARE

## 2023-01-14 VITALS — BP 150/73 | HR 66 | Resp 18 | Ht 60.0 in | Wt 208.9 lb

## 2023-01-14 DIAGNOSIS — Z79899 Other long term (current) drug therapy: Secondary | ICD-10-CM | POA: Diagnosis not present

## 2023-01-14 DIAGNOSIS — I480 Paroxysmal atrial fibrillation: Secondary | ICD-10-CM | POA: Insufficient documentation

## 2023-01-14 DIAGNOSIS — Z01818 Encounter for other preprocedural examination: Secondary | ICD-10-CM

## 2023-01-14 DIAGNOSIS — Z87891 Personal history of nicotine dependence: Secondary | ICD-10-CM | POA: Insufficient documentation

## 2023-01-14 DIAGNOSIS — C50911 Malignant neoplasm of unspecified site of right female breast: Secondary | ICD-10-CM | POA: Diagnosis not present

## 2023-01-14 DIAGNOSIS — Z01812 Encounter for preprocedural laboratory examination: Secondary | ICD-10-CM | POA: Insufficient documentation

## 2023-01-14 DIAGNOSIS — E039 Hypothyroidism, unspecified: Secondary | ICD-10-CM | POA: Diagnosis not present

## 2023-01-14 DIAGNOSIS — I10 Essential (primary) hypertension: Secondary | ICD-10-CM | POA: Insufficient documentation

## 2023-01-14 HISTORY — DX: Essential (primary) hypertension: I10

## 2023-01-14 LAB — CBC
HCT: 41.8 % (ref 36.0–46.0)
Hemoglobin: 13.9 g/dL (ref 12.0–15.0)
MCH: 31.2 pg (ref 26.0–34.0)
MCHC: 33.3 g/dL (ref 30.0–36.0)
MCV: 93.7 fL (ref 80.0–100.0)
Platelets: 258 10*3/uL (ref 150–400)
RBC: 4.46 MIL/uL (ref 3.87–5.11)
RDW: 13.2 % (ref 11.5–15.5)
WBC: 9.4 10*3/uL (ref 4.0–10.5)
nRBC: 0 % (ref 0.0–0.2)

## 2023-01-14 LAB — BASIC METABOLIC PANEL
Anion gap: 10 (ref 5–15)
BUN: 15 mg/dL (ref 8–23)
CO2: 27 mmol/L (ref 22–32)
Calcium: 9.6 mg/dL (ref 8.9–10.3)
Chloride: 103 mmol/L (ref 98–111)
Creatinine, Ser: 0.69 mg/dL (ref 0.44–1.00)
GFR, Estimated: >60 mL/min (ref >60)
Glucose, Bld: 92 mg/dL (ref 70–99)
Potassium: 4.4 mmol/L (ref 3.5–5.1)
Sodium: 140 mmol/L (ref 135–145)

## 2023-01-14 NOTE — Progress Notes (Signed)
PCP: Dr. Riley Nearing Cardiologist: Dr. Rudolpho Sevin  EKG: 10-24-22. C.E. Faxed request CXR: n/a ECHO: 11-25-21 Stress Test: denies Cardiac Cath: denies  Pt is not on blood thinners for A-fib--reports she was never started on Eliquis but "there will be conversation post surgery with Dr. Rudolpho Sevin" if she will need to start blood thinners. Pt plans to contact Dr. Rudolpho Sevin for ASA instructions prior to surgery.   ERAS: clear liquids  Patient denies shortness of breath, fever, cough, and chest pain at PAT appointment.  Patient verbalized understanding of instructions provided today at the PAT appointment.  Patient asked to review instructions at home and day of surgery.

## 2023-01-15 NOTE — Progress Notes (Signed)
Anesthesia Chart Review:  Case: [id number] Date/Time: 01/22/23 1115   Procedures:      RIGHT BREAST LUMPECTOMY WITH RADIOACTIVE SEED X2 AND SENTINEL LYMPH NODE BIOPSY (Right: Breast)     LEFT BREAST LUMPECTOMY WITH RADIOACTIVE SEED LOCALIZATION X2 (Left: Breast)   Anesthesia type: General   Pre-op diagnosis: RIGHT BREAST CANCER   [facility] ROOM 09 / [facility] OR   Surgeons: [name], MD       DISCUSSION: Patient is a 67 year old female scheduled for the above procedure.  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  Her last visit with [facility] cardiologist Dr. Rudolpho Sevin was on 10/24/22. He has followed her for over five years for PAF which was first diagnosed in September 2011. She had been hesitant to start anticoagulation, but most recent Ziopatch monitor showed an increase in afib burden to 3% (had been 2% in 2022) and episodes of PAT, HR 47-200 bpm with average HR of 67 pm.  He decreased her flecainide to 100 mg Q AM and 50 mg at night due to bradycardia. He continued Cardizem CD 180 mg daily. Given afib burden with CHA2DS2-VASc 3, he recommended changing her ASA 81 mg to apixaban 5 mg BID. However, she indicated to RN staff that she never started apixaban and would reconsider starting after her breast surgery once she talks more with Dr. Rudolpho Sevin. 10/2021 echo showed LVEF 55-60%, mildly dilated LA, mild TR, no pulmonary hypertension. She denied SOB and chest pain per PAT RN visit.   Last PCP visit with Angelica Chessman, MD was on 12/24/22. He notes that she will need breast lumpectomy.   Recent  cardiology and primary care follow-up. She chose to not to start apixaban and prefers to readdress with Dr. Rudolpho Sevin after surgery. She was in SR by 10/24/22 EKG. Anesthesia team to evaluate on the day of surgery. RSL is scheduled for 01/20/23. Discussed with anesthesiologist Marcene Duos, MD.   VS: BP (!) 150/73   Pulse 66   Resp 18   Ht 5' (1.524 m)   Wt 94.8 kg   LMP 02/27/2010   SpO2 100%   BMI 40.80 kg/m    PROVIDERS: Angelica Chessman, MD is PCP Sandy Salaam, MD is cardiologist ([facility]) Malachy Mood, MD is HEM-[facility] - She is scheduled to see Dorothyann Gibbs, MD with [facility] on 02/19/23.   LABS: Labs reviewed: Acceptable for surgery. (all labs ordered are listed, but only abnormal results are displayed)  Labs Reviewed  BASIC METABOLIC PANEL  CBC    EKG: 10/24/2022 ([facility]): Sinus bradycardia 58 bpm.  Rightward axis. - Copy of shadow chart   CV: Cardiac monitor 07/18/22 ([facility]): "Abnormal Zio patch. That consistent with max heart rate of 200, minimum heart rate of 47 bpm at night and also average heart of 67 bpm. She has 3% burden of atrial fibrillation. And also she has paroxysmal atrial tachycardia."  US Carotid 12/10/21 ([facility]): Report is not viewable in Care Everywhere. Per 03/12/22 note by Dr. Riley Nearing, "Pt does have mild carotid stenosis left side." She was started on Crestor.   Echo 11/25/21 ([facility]) SUMMARY  Left ventricular systolic function is grossly normal.  LV  ejection fraction = 55-60%.  The left atrium is mildly dilated.  There is mild tricuspid regurgitation.  No pulmonary hypertension.  There is no pericardial effusion.  There is no significant change in comparison with the last study [11/04/17].    Past Medical History:  Diagnosis Date   Anxiety    chronic, panic attacks   Atrial fibrillation 06/02/2010   treated with Cardiazem and Lovenox at [facility], now ASA   Cancer    breast   Colon polyp    tubular adenoma   Hypertension     Hypothyroidism    Migraine    Tachycardia 05/2010   Thyroid disease 05/2010   hypothyroid    Past Surgical History:  Procedure Laterality Date   APPENDECTOMY  1967   BREAST BIOPSY     BREAST BIOPSY Right 11/28/2022   MM RT BREAST BX W LOC DEV 1ST LESION IMAGE BX SPEC STEREO GUIDE 11/28/2022 [facility]   BREAST LUMPECTOMY Left 10/2017   BREAST LUMPECTOMY WITH RADIOACTIVE SEED LOCALIZATION Left 11/18/2017   Procedure: LEFT BREAST LUMPECTOMY WITH RADIOACTIVE SEED LOCALIZATION;  Surgeon: [name], MD;  Location: [facility];  Service: General;  Laterality: Left;   HERNIA REPAIR N/A    WISDOM TOOTH EXTRACTION      MEDICATIONS:  acetaminophen (TYLENOL) 500 MG tablet   ALPRAZolam (XANAX) 0.25 MG tablet   aspirin EC 81 MG tablet   Cholecalciferol (VITAMIN D3) 2000 UNITS TABS   diltiazem (CARDIZEM CD) 180 MG 24 hr capsule   flecainide (TAMBOCOR) 100 MG tablet   GLUCOSAMINE SULFATE PO   ibuprofen (ADVIL,MOTRIN) 200 MG tablet   levothyroxine (SYNTHROID) 112 MCG tablet   losartan (COZAAR) 25 MG tablet   meclizine (ANTIVERT) 25 MG tablet   Multiple Vitamin (MULTIVITAMIN) tablet   rosuvastatin (CRESTOR) 5 MG tablet   trolamine salicylate (ASPERCREME) 10 % cream   valACYclovir (VALTREX) 500 MG tablet   No current facility-administered medications for this encounter.    Shonna Chock, PA-C Surgical Short Stay/Anesthesiology [facility] Phone [phone] [facility] Phone [phone] 01/16/2023 11:03 PM

## 2023-01-16 NOTE — Anesthesia Preprocedure Evaluation (Signed)
Anesthesia Evaluation  Patient identified by MRN, date of birth, ID band Patient awake    Reviewed: Allergy & Precautions, NPO status , Patient's Chart, lab work & pertinent test results  History of Anesthesia Complications Negative for: history of anesthetic complications  Airway Mallampati: II  TM Distance: >3 FB Neck ROM: Full    Dental  (+) Dental Advisory Given   Pulmonary neg shortness of breath, neg sleep apnea, neg COPD, neg recent URI, former smoker   Pulmonary exam normal breath sounds clear to auscultation       Cardiovascular hypertension, (-) angina (-) Past MI, (-) Cardiac Stents and (-) CABG + dysrhythmias (PVCs) Atrial Fibrillation  Rhythm:Regular Rate:Normal  HLD   Neuro/Psych  Headaches, neg Seizures  Anxiety        GI/Hepatic negative GI ROS, Neg liver ROS,,,  Endo/Other  neg diabetesHypothyroidism  Morbid obesity  Renal/GU negative Renal ROS     Musculoskeletal   Abdominal  (+) + obese  Peds  Hematology negative hematology ROS (+)   Anesthesia Other Findings Breast cancer  Reproductive/Obstetrics                             Anesthesia Physical Anesthesia Plan  ASA: 3  Anesthesia Plan: General and Regional   Post-op Pain Management: Regional block* and Tylenol PO (pre-op)*   Induction: Intravenous  PONV Risk Score and Plan: 3 and Ondansetron, Dexamethasone and Treatment may vary due to age or medical condition  Airway Management Planned: LMA  Additional Equipment:   Intra-op Plan:   Post-operative Plan: Extubation in OR  Informed Consent: I have reviewed the patients History and Physical, chart, labs and discussed the procedure including the risks, benefits and alternatives for the proposed anesthesia with the patient or authorized representative who has indicated his/her understanding and acceptance.       Plan Discussed with: Anesthesiologist and  CRNA  Anesthesia Plan Comments: (PAT note written 01/16/2023 by Shonna Chock, PA-C.  Discussed potential risks of nerve blocks including, but not limited to, infection, bleeding, nerve damage, seizures, pneumothorax, respiratory depression, and potential failure of the block. Alternatives to nerve blocks discussed. All questions answered.  Risks of general anesthesia discussed including, but not limited to, sore throat, hoarse voice, chipped/damaged teeth, injury to vocal cords, nausea and vomiting, allergic reactions, lung infection, heart attack, stroke, and death. All questions answered.  )       Anesthesia Quick Evaluation

## 2023-01-20 ENCOUNTER — Ambulatory Visit
Admission: RE | Admit: 2023-01-20 | Discharge: 2023-01-20 | Disposition: A | Discharge status: Home / Self Care | Payer: MEDICARE | Source: Ambulatory Visit | Attending: General Surgery | Admitting: General Surgery

## 2023-01-20 DIAGNOSIS — C50311 Malignant neoplasm of lower-inner quadrant of right female breast: Secondary | ICD-10-CM

## 2023-01-20 DIAGNOSIS — C50812 Malignant neoplasm of overlapping sites of left female breast: Secondary | ICD-10-CM

## 2023-01-20 DIAGNOSIS — C50811 Malignant neoplasm of overlapping sites of right female breast: Secondary | ICD-10-CM

## 2023-01-20 HISTORY — PX: BREAST BIOPSY: SHX20

## 2023-01-22 ENCOUNTER — Other Ambulatory Visit: Payer: Self-pay

## 2023-01-22 ENCOUNTER — Ambulatory Visit
Admission: RE | Admit: 2023-01-22 | Discharge: 2023-01-22 | Disposition: A | Discharge status: Home / Self Care | Payer: MEDICARE | Source: Ambulatory Visit | Attending: General Surgery | Admitting: General Surgery

## 2023-01-22 ENCOUNTER — Encounter (HOSPITAL_COMMUNITY)
Admission: RE | Disposition: A | Discharge status: Home / Self Care | Payer: Self-pay | Source: Ambulatory Visit | Attending: General Surgery

## 2023-01-22 ENCOUNTER — Ambulatory Visit (HOSPITAL_COMMUNITY): Payer: MEDICARE | Admitting: Vascular Surgery

## 2023-01-22 ENCOUNTER — Encounter (HOSPITAL_COMMUNITY): Payer: Self-pay | Admitting: General Surgery

## 2023-01-22 ENCOUNTER — Ambulatory Visit (HOSPITAL_COMMUNITY)
Admission: RE | Admit: 2023-01-22 | Discharge: 2023-01-22 | Disposition: A | Discharge status: Home / Self Care | Payer: MEDICARE | Source: Ambulatory Visit | Attending: General Surgery | Admitting: General Surgery

## 2023-01-22 DIAGNOSIS — F419 Anxiety disorder, unspecified: Secondary | ICD-10-CM | POA: Diagnosis not present

## 2023-01-22 DIAGNOSIS — C50812 Malignant neoplasm of overlapping sites of left female breast: Secondary | ICD-10-CM | POA: Diagnosis present

## 2023-01-22 DIAGNOSIS — Z170 Estrogen receptor positive status [ER+]: Secondary | ICD-10-CM | POA: Insufficient documentation

## 2023-01-22 DIAGNOSIS — I10 Essential (primary) hypertension: Secondary | ICD-10-CM | POA: Insufficient documentation

## 2023-01-22 DIAGNOSIS — I4891 Unspecified atrial fibrillation: Secondary | ICD-10-CM | POA: Diagnosis not present

## 2023-01-22 DIAGNOSIS — Z6841 Body Mass Index (BMI) 40.0 and over, adult: Secondary | ICD-10-CM

## 2023-01-22 DIAGNOSIS — N6021 Fibroadenosis of right breast: Secondary | ICD-10-CM | POA: Insufficient documentation

## 2023-01-22 DIAGNOSIS — C50111 Malignant neoplasm of central portion of right female breast: Secondary | ICD-10-CM | POA: Diagnosis not present

## 2023-01-22 DIAGNOSIS — N6011 Diffuse cystic mastopathy of right breast: Secondary | ICD-10-CM | POA: Insufficient documentation

## 2023-01-22 DIAGNOSIS — Z87891 Personal history of nicotine dependence: Secondary | ICD-10-CM | POA: Insufficient documentation

## 2023-01-22 DIAGNOSIS — E039 Hypothyroidism, unspecified: Secondary | ICD-10-CM | POA: Insufficient documentation

## 2023-01-22 DIAGNOSIS — C50911 Malignant neoplasm of unspecified site of right female breast: Secondary | ICD-10-CM

## 2023-01-22 DIAGNOSIS — N6022 Fibroadenosis of left breast: Secondary | ICD-10-CM | POA: Diagnosis not present

## 2023-01-22 DIAGNOSIS — N6012 Diffuse cystic mastopathy of left breast: Secondary | ICD-10-CM | POA: Diagnosis not present

## 2023-01-22 DIAGNOSIS — N6489 Other specified disorders of breast: Secondary | ICD-10-CM | POA: Diagnosis not present

## 2023-01-22 HISTORY — PX: BREAST LUMPECTOMY WITH RADIOACTIVE SEED AND SENTINEL LYMPH NODE BIOPSY: SHX6550

## 2023-01-22 HISTORY — PX: BREAST LUMPECTOMY: SHX2

## 2023-01-22 HISTORY — PX: BREAST LUMPECTOMY WITH RADIOACTIVE SEED LOCALIZATION: SHX6424

## 2023-01-22 SURGERY — BREAST LUMPECTOMY WITH RADIOACTIVE SEED AND SENTINEL LYMPH NODE BIOPSY
Anesthesia: Regional | Site: Breast | Laterality: Right

## 2023-01-22 MED ORDER — PROPOFOL 10 MG/ML IV BOLUS
INTRAVENOUS | Status: AC
  Filled 2023-01-22: qty 20

## 2023-01-22 MED ORDER — MIDAZOLAM HCL 2 MG/2ML IJ SOLN
INTRAMUSCULAR | Status: AC
  Administered 2023-01-22: 2 mg
  Filled 2023-01-22: qty 2

## 2023-01-22 MED ORDER — ORAL CARE MOUTH RINSE: 15.0000 mL | Freq: Once | OROMUCOSAL | Status: AC

## 2023-01-22 MED ORDER — LIDOCAINE HCL (PF) 1 % IJ SOLN
INTRAMUSCULAR | Status: AC
  Filled 2023-01-22: qty 30

## 2023-01-22 MED ORDER — CHLORHEXIDINE GLUCONATE CLOTH 2 % EX PADS: 6.0000 | MEDICATED_PAD | Freq: Once | CUTANEOUS | Status: DC

## 2023-01-22 MED ORDER — MIDAZOLAM HCL 2 MG/2ML IJ SOLN
INTRAMUSCULAR | Status: DC | PRN
  Administered 2023-01-22: 2 mg via INTRAVENOUS

## 2023-01-22 MED ORDER — ONDANSETRON HCL 4 MG/2ML IJ SOLN
INTRAMUSCULAR | Status: AC
  Filled 2023-01-22: qty 2

## 2023-01-22 MED ORDER — PROPOFOL 10 MG/ML IV BOLUS
INTRAVENOUS | Status: DC | PRN
  Administered 2023-01-22: 200 mg via INTRAVENOUS

## 2023-01-22 MED ORDER — ACETAMINOPHEN 500 MG PO TABS: 1000.0000 mg | ORAL_TABLET | ORAL | Status: AC

## 2023-01-22 MED ORDER — MIDAZOLAM HCL 2 MG/2ML IJ SOLN
INTRAMUSCULAR | Status: AC
  Filled 2023-01-22: qty 2

## 2023-01-22 MED ORDER — HYDROCODONE-ACETAMINOPHEN 5-325 MG PO TABS
1.0000 | ORAL_TABLET | Freq: Four times a day (QID) | ORAL | 0 refills | Status: DC | PRN
Start: 2023-01-22 — End: 2023-04-22

## 2023-01-22 MED ORDER — DEXAMETHASONE SODIUM PHOSPHATE 10 MG/ML IJ SOLN
INTRAMUSCULAR | Status: AC
  Filled 2023-01-22: qty 1

## 2023-01-22 MED ORDER — ACETAMINOPHEN 500 MG PO TABS
ORAL_TABLET | ORAL | Status: AC
  Administered 2023-01-22: 1000 mg via ORAL
  Filled 2023-01-22: qty 2

## 2023-01-22 MED ORDER — FENTANYL CITRATE (PF) 100 MCG/2ML IJ SOLN
INTRAMUSCULAR | Status: AC
  Filled 2023-01-22: qty 2

## 2023-01-22 MED ORDER — ONDANSETRON HCL 4 MG/2ML IJ SOLN
INTRAMUSCULAR | Status: DC | PRN
  Administered 2023-01-22: 4 mg via INTRAVENOUS

## 2023-01-22 MED ORDER — DEXAMETHASONE SODIUM PHOSPHATE 10 MG/ML IJ SOLN
INTRAMUSCULAR | Status: DC | PRN
  Administered 2023-01-22: 10 mg via INTRAVENOUS

## 2023-01-22 MED ORDER — ROCURONIUM BROMIDE 10 MG/ML (PF) SYRINGE
PREFILLED_SYRINGE | INTRAVENOUS | Status: AC
  Filled 2023-01-22: qty 10

## 2023-01-22 MED ORDER — LIDOCAINE 2% (20 MG/ML) 5 ML SYRINGE
INTRAMUSCULAR | Status: DC | PRN
  Administered 2023-01-22: 100 mg via INTRAVENOUS

## 2023-01-22 MED ORDER — 0.9 % SODIUM CHLORIDE (POUR BTL) OPTIME
TOPICAL | Status: DC | PRN
  Administered 2023-01-22: 1000 mL

## 2023-01-22 MED ORDER — MAGTRACE LYMPHATIC TRACER
INTRAMUSCULAR | Status: DC | PRN
  Administered 2023-01-22: 2 mL via INTRAMUSCULAR

## 2023-01-22 MED ORDER — CHLORHEXIDINE GLUCONATE 0.12 % MT SOLN
OROMUCOSAL | Status: AC
  Administered 2023-01-22: 15 mL via OROMUCOSAL
  Filled 2023-01-22: qty 15

## 2023-01-22 MED ORDER — LIDOCAINE 2% (20 MG/ML) 5 ML SYRINGE
INTRAMUSCULAR | Status: AC
  Filled 2023-01-22: qty 5

## 2023-01-22 MED ORDER — CIPROFLOXACIN IN D5W 400 MG/200ML IV SOLN
400.0000 mg | INTRAVENOUS | Status: AC
  Administered 2023-01-22: 400 mg via INTRAVENOUS

## 2023-01-22 MED ORDER — LACTATED RINGERS IV SOLN: INTRAVENOUS | Status: DC

## 2023-01-22 MED ORDER — EPHEDRINE SULFATE-NACL 50-0.9 MG/10ML-% IV SOSY
PREFILLED_SYRINGE | INTRAVENOUS | Status: DC | PRN
  Administered 2023-01-22 (×3): 10 mg via INTRAVENOUS
  Administered 2023-01-22: 5 mg via INTRAVENOUS
  Administered 2023-01-22: 10 mg via INTRAVENOUS

## 2023-01-22 MED ORDER — LIDOCAINE HCL 1 % IJ SOLN
INTRAMUSCULAR | Status: DC | PRN
  Administered 2023-01-22: 30 mL

## 2023-01-22 MED ORDER — BUPIVACAINE HCL (PF) 0.25 % IJ SOLN
INTRAMUSCULAR | Status: DC | PRN
  Administered 2023-01-22: 30 mL via PERINEURAL

## 2023-01-22 MED ORDER — FENTANYL CITRATE (PF) 250 MCG/5ML IJ SOLN
INTRAMUSCULAR | Status: DC | PRN
  Administered 2023-01-22 (×5): 50 ug via INTRAVENOUS

## 2023-01-22 MED ORDER — BUPIVACAINE-EPINEPHRINE (PF) 0.25% -1:200000 IJ SOLN
INTRAMUSCULAR | Status: AC
  Filled 2023-01-22: qty 30

## 2023-01-22 MED ORDER — CIPROFLOXACIN IN D5W 400 MG/200ML IV SOLN
INTRAVENOUS | Status: AC
  Filled 2023-01-22: qty 200

## 2023-01-22 MED ORDER — CHLORHEXIDINE GLUCONATE 0.12 % MT SOLN: 15.0000 mL | Freq: Once | OROMUCOSAL | Status: AC

## 2023-01-22 MED ORDER — FENTANYL CITRATE (PF) 250 MCG/5ML IJ SOLN
INTRAMUSCULAR | Status: AC
  Filled 2023-01-22: qty 5

## 2023-01-22 SURGICAL SUPPLY — 58 items
ADH SKN CLS APL DERMABOND .7 (GAUZE/BANDAGES/DRESSINGS) ×2
APL PRP STRL LF DISP 70% ISPRP (MISCELLANEOUS) ×2
BAG COUNTER SPONGE SURGICOUNT (BAG) ×2 IMPLANT
BAG SPNG CNTER NS LX DISP (BAG) ×2
BINDER BREAST LRG (GAUZE/BANDAGES/DRESSINGS) IMPLANT
BINDER BREAST XLRG (GAUZE/BANDAGES/DRESSINGS) IMPLANT
BNDG CMPR 5X4 CHSV STRCH STRL (GAUZE/BANDAGES/DRESSINGS) ×2
BNDG COHESIVE 4X5 TAN STRL LF (GAUZE/BANDAGES/DRESSINGS) ×2 IMPLANT
CANISTER SUCT 3000ML PPV (MISCELLANEOUS) ×2 IMPLANT
CHLORAPREP W/TINT 26 (MISCELLANEOUS) ×2 IMPLANT
CLIP TI LARGE 6 (CLIP) ×2 IMPLANT
CLIP TI MEDIUM 24 (CLIP) ×2 IMPLANT
CNTNR URN SCR LID CUP LEK RST (MISCELLANEOUS) IMPLANT
CONT SPEC 4OZ STRL OR WHT (MISCELLANEOUS) ×20
COVER PROBE W GEL 5X96 (DRAPES) ×4 IMPLANT
COVER SURGICAL LIGHT HANDLE (MISCELLANEOUS) ×2 IMPLANT
DERMABOND ADVANCED .7 DNX12 (GAUZE/BANDAGES/DRESSINGS) ×2 IMPLANT
DEVICE DUBIN SPECIMEN MAMMOGRA (MISCELLANEOUS) ×2 IMPLANT
DRAPE CHEST BREAST 15X10 FENES (DRAPES) ×2 IMPLANT
DRAPE SURG 17X23 STRL (DRAPES) IMPLANT
ELECT BLADE 4.0 EZ CLEAN MEGAD (MISCELLANEOUS) ×2
ELECT COATED BLADE 2.86 ST (ELECTRODE) ×2 IMPLANT
ELECT REM PT RETURN 9FT ADLT (ELECTROSURGICAL) ×2
ELECTRODE BLDE 4.0 EZ CLN MEGD (MISCELLANEOUS) IMPLANT
ELECTRODE REM PT RTRN 9FT ADLT (ELECTROSURGICAL) ×2 IMPLANT
GAUZE PAD ABD 8X10 STRL (GAUZE/BANDAGES/DRESSINGS) ×2 IMPLANT
GAUZE SPONGE 4X4 12PLY STRL LF (GAUZE/BANDAGES/DRESSINGS) ×2 IMPLANT
GLOVE BIO SURGEON STRL SZ 6 (GLOVE) ×2 IMPLANT
GLOVE INDICATOR 6.5 STRL GRN (GLOVE) ×2 IMPLANT
GOWN STRL REUS W/ TWL LRG LVL3 (GOWN DISPOSABLE) ×2 IMPLANT
GOWN STRL REUS W/ TWL XL LVL3 (GOWN DISPOSABLE) ×2 IMPLANT
GOWN STRL REUS W/TWL LRG LVL3 (GOWN DISPOSABLE) ×2
GOWN STRL REUS W/TWL XL LVL3 (GOWN DISPOSABLE) ×2
KIT BASIN OR (CUSTOM PROCEDURE TRAY) ×2 IMPLANT
KIT MARKER MARGIN INK (KITS) ×2 IMPLANT
LIGHT WAVEGUIDE WIDE FLAT (MISCELLANEOUS) IMPLANT
NDL 18GX1X1/2 (RX/OR ONLY) (NEEDLE) IMPLANT
NDL FILTER BLUNT 18X1 1/2 (NEEDLE) IMPLANT
NDL HYPO 25GX1X1/2 BEV (NEEDLE) ×2 IMPLANT
NEEDLE 18GX1X1/2 (RX/OR ONLY) (NEEDLE) IMPLANT
NEEDLE FILTER BLUNT 18X1 1/2 (NEEDLE) IMPLANT
NEEDLE HYPO 25GX1X1/2 BEV (NEEDLE) ×2 IMPLANT
NS IRRIG 1000ML POUR BTL (IV SOLUTION) ×2 IMPLANT
PACK GENERAL/GYN (CUSTOM PROCEDURE TRAY) ×2 IMPLANT
PACK UNIVERSAL I (CUSTOM PROCEDURE TRAY) ×2 IMPLANT
STOCKINETTE IMPERVIOUS 9X36 MD (GAUZE/BANDAGES/DRESSINGS) ×2 IMPLANT
STRIP CLOSURE SKIN 1/2X4 (GAUZE/BANDAGES/DRESSINGS) ×2 IMPLANT
SUT MNCRL AB 4-0 PS2 18 (SUTURE) ×4 IMPLANT
SUT SILK 2 0 SH (SUTURE) IMPLANT
SUT VIC AB 2-0 SH 27 (SUTURE) ×2
SUT VIC AB 2-0 SH 27XBRD (SUTURE) IMPLANT
SUT VIC AB 3-0 SH 27 (SUTURE) ×2
SUT VIC AB 3-0 SH 27X BRD (SUTURE) ×2 IMPLANT
SUT VIC AB 3-0 SH 8-18 (SUTURE) ×2 IMPLANT
SYR 3ML LL SCALE MARK (SYRINGE) IMPLANT
SYR CONTROL 10ML LL (SYRINGE) ×2 IMPLANT
TOWEL GREEN STERILE (TOWEL DISPOSABLE) ×2 IMPLANT
TOWEL GREEN STERILE FF (TOWEL DISPOSABLE) ×2 IMPLANT

## 2023-01-22 NOTE — Anesthesia Procedure Notes (Signed)
Procedure Name: LMA Insertion Date/Time: 01/22/2023 2:11 PM  Performed by: Orlin Hilding, CRNAPre-anesthesia Checklist: Patient identified, Emergency Drugs available, Suction available, Timeout performed and Patient being monitored Patient Re-evaluated:Patient Re-evaluated prior to induction Oxygen Delivery Method: Circle system utilized Preoxygenation: Pre-oxygenation with 100% oxygen Induction Type: IV induction LMA: LMA with gastric port inserted LMA Size: 4.0 Number of attempts: 1 Placement Confirmation: positive ETCO2 and breath sounds checked- equal and bilateral Tube secured with: Tape

## 2023-01-22 NOTE — Transfer of Care (Signed)
Immediate Anesthesia Transfer of Care Note  Patient: Theresa Duncan  Procedure(s) Performed: RIGHT BREAST LUMPECTOMY WITH RADIOACTIVE SEED X2 AND SENTINEL LYMPH NODE BIOPSY (Right: Breast) LEFT BREAST LUMPECTOMY WITH RADIOACTIVE SEED LOCALIZATION X2 (Left: Breast)  Patient Location: PACU  Anesthesia Type:General  Level of Consciousness: awake, alert , and oriented  Airway & Oxygen Therapy: Patient Spontanous Breathing  Post-op Assessment: Report given to RN and Post -op Vital signs reviewed and stable  Post vital signs: Reviewed and stable  Last Vitals:  Vitals Value Taken Time  BP 177/163 01/22/23 1719  Temp 98   Pulse 93 01/22/23 1720  Resp 14 01/22/23 1720  SpO2 95 % 01/22/23 1720  Vitals shown include unvalidated device data.  Last Pain:  Vitals:   01/22/23 0919  TempSrc:   PainSc: 0-No pain         Complications: No notable events documented.

## 2023-01-22 NOTE — Discharge Instructions (Addendum)
[facility] Office Phone Number [phone]  BREAST BIOPSY/ PARTIAL MASTECTOMY: POST OP INSTRUCTIONS  Always review your discharge instruction sheet given to you by the facility where your surgery was performed.  IF YOU HAVE DISABILITY OR FAMILY LEAVE FORMS, YOU MUST BRING THEM TO THE OFFICE FOR PROCESSING.  DO NOT GIVE THEM TO YOUR DOCTOR.  Take 2 tylenol (acetominophen) three times a day for 3 days.  If you still have pain, add ibuprofen with food in between if able to take this (if you have kidney issues or stomach issues, do not take ibuprofen).  If both of those are not enough, add the narcotic pain pill.  If you find you are needing a lot of this overnight after surgery, call the next morning for a refill.    Prescriptions will not be filled after 5pm or on week-ends. Take your usually prescribed medications unless otherwise directed You should eat very light the first 24 hours after surgery, such as soup, crackers, pudding, etc.  Resume your normal diet the day after surgery. Most patients will experience some swelling and bruising in the breast.  Ice packs and a good support bra will help.  Swelling and bruising can take several days to resolve.  It is common to experience some constipation if taking pain medication after surgery.  Increasing fluid intake and taking a stool softener will usually help or prevent this problem from occurring.  A mild laxative (Milk of Magnesia or Miralax) should be taken according to package directions if there are no bowel movements after 48 hours. Unless discharge instructions indicate otherwise, you may remove your bandages 48 hours after surgery, and you may shower at that time.  You may have steri-strips (small skin tapes) in place directly over the incision.  These strips should be left on the skin at least for for 7-10 days.    ACTIVITIES:  You may resume regular daily activities (gradually increasing) beginning the next day.  Wearing a  good support bra or sports bra (or the breast binder) minimizes pain and swelling.  You may have sexual intercourse when it is comfortable. No heavy lifting for 1-2 weeks (not over around 10 pounds).  You may drive when you no longer are taking prescription pain medication, you can comfortably wear a seatbelt, and you can safely maneuver your car and apply brakes. RETURN TO WORK:  __________3-14 days depending on job. _______________ You should see your doctor in the office for a follow-up appointment approximately two weeks after your surgery.  Your doctor's nurse will typically make your follow-up appointment when she calls you with your pathology report.  Expect your pathology report 3-4 business days after your surgery.  You may call to check if you do not hear from us after three days.   WHEN TO CALL YOUR DOCTOR: Fever over 101.0 Nausea and/or vomiting. Extreme swelling or bruising. Continued bleeding from incision. Increased pain, redness, or drainage from the incision.  The clinic staff is available to answer your questions during regular business hours.  Please don't hesitate to call and ask to speak to one of the nurses for clinical concerns.  If you have a medical emergency, go to the nearest emergency room or call 911.  A surgeon from [facility] is always on call at the hospital.  For further questions, please visit [email or web address]   

## 2023-01-22 NOTE — Interval H&P Note (Signed)
History and Physical Interval Note:  01/22/2023 12:29 PM  Theresa Duncan  has presented today for surgery, with the diagnosis of RIGHT BREAST CANCER.  The various methods of treatment have been discussed with the patient and family. After consideration of risks, benefits and other options for treatment, the patient has consented to  Procedure(s): RIGHT BREAST LUMPECTOMY WITH RADIOACTIVE SEED X2 AND SENTINEL LYMPH NODE BIOPSY (Right) LEFT BREAST LUMPECTOMY WITH RADIOACTIVE SEED LOCALIZATION X2 (Left) as a surgical intervention.  The patient's history has been reviewed, patient examined, no change in status, stable for surgery.  I have reviewed the patient's chart and labs.  Questions were answered to the patient's satisfaction.     [name]

## 2023-01-22 NOTE — H&P (Signed)
PROVIDER: Matthias Hughs, MD Patient Care Team: Lavella Hammock, MD as PCP - General (Family Medicine) Matthias Hughs, MD as Consulting Provider (Surgical Oncology) Malachy Mood, MD (Hematology and Oncology)  MRN: [id number] DOB: 1956-09-16 DATE OF ENCOUNTER: 01/05/2023  Chief Complaint: Follow-up (discuss breast sx)   History of Present Illness: Theresa Duncan is a 67 y.o. female who is seen today for follow up of LCIS.  Initial history:  Patient was diagnosed with left sided LCIS in 2019. She had presented with an atypical left mammogram that had distortion in December 2018. Core needle biopsy showed atypical lobular hyperplasia with mammary carcinoma in situ. Seed localized excisional biopsy in February 2019 showed LCIS. Postop course was complicated by seroma requiring aspiration.  She saw oncology and discussed taking anastrozole to decrease her risk of breast cancer. She decided at that time not to take it. She had been following up with Dr. Blake Divine on an annual basis.  Unfortunately after she retired several months later her husband had a significant stroke and she was taking care of him and working with therapy. He improved a significant amount and is able to do most of his ADLs.  At her visit with Dr. Mosetta Putt 02/2022 she discussed a low-dose tamoxifen preventative strategy.   Interval history:  Pt was diagnosed with right breast cancer 10/2022. Pt underwent screening mammogram at [facility] 10/22/2022. She had screening detected distortion/asymmetry in the LIQ on the right. There was no sonographic correlate. Core needle biopsy was performed and it showed invasive mammary carcinoma (lobular), +/+/-, Ki 67 5%. There was at least 4 mm of cancer seen on the biopsy.  Understandably, the patient is a bit anxious regarding this. She is concerned that there was another area that was seen on her screening mammogram that was not seen on the diagnostic mammogram and is wondering if  that needs to be reevaluated. She is also concerned about timing of some of the other things like her husband's appointments and her colonoscopy.  Pt underwent MRI breast and had multiple abnormal spots. She subsequently underwent 3 additional biopsies. So now she has bilateral breast cancer, right cT2N0, left cTis.  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 Biopsy-proven malignancy within the LOWER RIGHT breast, with entire area of non masslike enhancement and nodular components measuring 3 x 2.5 x 2.5 cm. 2. Indeterminate 4 cm area of non masslike enhancement within the anterior to middle depth UPPER RIGHT breast. Tissue sampling recommended. 3. Indeterminate 2.5 cm anterior LOWER INNER LEFT breast non masslike enhancement. Tissue sampling recommended. 4. Indeterminate 3.5 cm non masslike enhancement with some nodular components in the UPPER LEFT breast. Tissue sampling recommended. RECOMMENDATION: MR guided biopsies of indeterminate UPPER RIGHT breast non masslike enhancement, indeterminate LOWER LEFT breast non masslike enhancement and indeterminate UPPER LEFT breast non masslike enhancement. BI-RADS CATEGORY 4: Suspicious.  MRI biopsies 12/24/2022 1. Breast, right, needle core biopsy, upper central, cylinder clip - EXTENSIVE LOBULAR CARCINOMA IN SITU (LCIS) INVOLVING A COMPLEX SCLEROSING LESION. - CALCIFICATIONS PRESENT IN BOTH LCIS AND BENIGN BREAST TISSUE. 2. Breast, left, needle core biopsy, upper central, cylinder clip - DUCTAL CARCINOMA IN SITU, SOLID TYPE, NUCLEAR GRADE 2 OF 3 - NECROSIS: PRESENT - CALCIFICATIONS: PRESENT IN BENIGN BREAST TISSUE; NOT PRESENT IN THE FOCAL DCIS -  DCIS LENGTH: SINGLE DUCT MEASURING LESS THAN 1 MM. 3. Breast, left, needle core biopsy, lower inner, barbell  clip - DUCTAL CARCINOMA IN SITU, SOLID AND CRIBRIFORM TYPE, NUCLEAR GRADE 2 OF 3 - NECROSIS: NOT IDENTIFIED - CALCIFICATIONS: PRESENT IN BOTH DCIS AND BENIGN BREAST TISSUE - DCIS LENGTH: 2 MM  Pathology right index biopsy lower breast 11/28/2022 Breast, right, needle core biopsy, calcification assorted with a focal asymmetry INVASIVE MAMMARY CARCINOMA, SEE NOTE MAMMARY CARCINOMA IN SITU, SOLID, INTERMEDIATE NUCLEAR GRADE TUBULE FORMATION: SCORE 3 NUCLEAR PLEOMORPHISM: SCORE 2 MITOTIC COUNT: SCORE 1 TOTAL SCORE: 6 OVERALL GRADE: 2 LYMPHOVASCULAR INVASION: NOT IDENTIFIED CANCER LENGTH: 0.4 CM CALCIFICATIONS: PRESENT  Receptors Estrogen Receptor: 100%, POSITIVE, STRONG STAINING INTENSITY Progesterone Receptor: 90%, POSITIVE, STRONG STAINING INTENSITY Proliferation Marker Ki67: 5% GROUP 5: HER2 **NEGATIVE**  Review of Systems: A complete review of systems was obtained from the patient. I have reviewed this information and discussed as appropriate with the patient. See HPI as well for other ROS.  Review of Systems All other systems reviewed and are negative.   Medical History: Past Medical History: Diagnosis Date History of cancer Hyperlipidemia Hypertension Thyroid disease  Patient Active Problem List Diagnosis Breast neoplasm, Tis (LCIS), left Malignant neoplasm of lower-outer quadrant of right breast of female, estrogen receptor positive (CMS/HHS-HCC) Claustrophobia Anxiety Adenomatous polyp of colon Malignant neoplasm of upper-outer quadrant of left breast in female, estrogen receptor positive (CMS/HHS-HCC) Malignant neoplasm of lower-inner quadrant of left breast in female, estrogen receptor positive (CMS/HHS-HCC) Cancer of overlapping sites of right breast (CMS/HHS-HCC) Cancer of overlapping sites of left breast (CMS/HHS-HCC)  Past Surgical History: Procedure Laterality Date LEFT BREAST LUMPECTOMY WITH RADIOACTIVE SEED LOCALIZATION 11/18/2017 Dr.  Donell Beers   Allergies Allergen Reactions Penicillins Hives and Other (See Comments) Adhesive Rash  Current Outpatient Medications on File Prior to Visit Medication Sig Dispense Refill ALPRAZolam (XANAX) 0.25 MG tablet Take by mouth at bedtime as needed aspirin 81 MG chewable tablet 1 tablet Orally Once a day cholecalciferol (VITAMIN D3) 1000 unit tablet Take by mouth dilTIAZem (CARDIZEM CD) 180 MG CD capsule Take 180 mg by mouth once daily flecainide (TAMBOCOR) 100 MG tablet TAKE 100 MG TWICE A DAY levothyroxine (SYNTHROID) 112 MCG tablet TAKE 1 TABLET (112 MCG TOTAL) BY MOUTH DAILY AT 0600. losartan (COZAAR) 25 MG tablet Take 25 mg by mouth once daily meclizine (ANTIVERT) 25 mg tablet Take by mouth 3 (three) times daily as needed multivitamin tablet Take 1 tablet by mouth once daily rosuvastatin (CRESTOR) 5 MG tablet Take 5 mg by mouth once daily valACYclovir (VALTREX) 500 MG tablet Take by mouth at bedtime as needed  No current facility-administered medications on file prior to visit.  Family History Problem Relation Age of Onset High blood pressure (Hypertension) Sister Hyperlipidemia (Elevated cholesterol) Sister Breast cancer Sister   Social History  Tobacco Use Smoking Status Former Types: Cigarettes Quit date: 2011 Years since quitting: 13.2 Smokeless Tobacco Never   Social History  Socioeconomic History Marital status: Married Tobacco Use Smoking status: Former Types: Cigarettes Quit date: 2011 Years since quitting: 13.2 Smokeless tobacco: Never Substance and Sexual Activity Alcohol use: Never Drug use: Never  Objective:  There were no vitals filed for this visit.  There is no height or weight on file to calculate BMI.  Head: Normocephalic and atraumatic. Eyes: Conjunctivae are normal. Pupils are equal, round, and reactive to light. No scleral icterus. Resp: No respiratory distress, normal effort. Breast: bruising bilateral breasts at biopsy sites.  No LAD. Abd: Abdomen is soft,  non distended and non tender. No masses are palpable. There is no rebound and no guarding. Neurological: Alert and oriented to person, place, and time. Coordination normal. Skin: Skin is warm and dry. No rash noted. No diaphoretic. No erythema. No pallor. Psychiatric: Normal mood and affect. Normal behavior. Judgment and thought content normal.  Labs, Imaging and Diagnostic Testing:  See above.  Assessment and Plan:  Diagnoses and all orders for this visit:  Malignant neoplasm of lower-outer quadrant of right breast of female, estrogen receptor positive (CMS/HHS-HCC)  Breast neoplasm, Tis (LCIS), left  Malignant neoplasm of upper-outer quadrant of left breast in female, estrogen receptor positive (CMS/HHS-HCC)  Malignant neoplasm of lower-inner quadrant of left breast in female, estrogen receptor positive (CMS/HHS-HCC)  Cancer of overlapping sites of right breast (CMS/HHS-HCC)  Cancer of overlapping sites of left breast (CMS/HHS-HCC)  The patient does have 2 areas per breast that are going to need to be removed. She does still desire breast conservation. Because of her breast size, I think this might still be possible. I discussed that she might end up needing more than 2 seeds her breast. I also discussed lymph node biopsy on the right. I reviewed that I would put her at at least 30% chance of reexcision. I discussed that if we had to reexcise more than twice that I would recommend mastectomy. I also discussed the possibility of considering reduction mammoplasty postop to get rid of some of the excess skin if needed.  I discussed that I would not consider breast conservation if she were unwilling to receive radiation or antihormone treatment. She is willing to do both of these.  I do think that she will need annual intent screening with either annual MRI or annual contrast mammography. I will discuss this with radiology.  The patient is also very  concerned about caring for her husband if she has bilateral mastectomies because of the amount of time that she would need help. She may also need more than 1 incision for breast depending on the exact location of the seeds. I discussed that at least 2 of the lesions might need more than 1 seed and I would use radiology's guidance for this.  We will do this is the first available opportunity.  No follow-ups on file.  Matthias Hughs, MD

## 2023-01-22 NOTE — Anesthesia Procedure Notes (Signed)
Anesthesia Regional Block: Pectoralis block   Pre-Anesthetic Checklist: , timeout performed,  Correct Patient, Correct Site, Correct Laterality,  Correct Procedure, Correct Position, site marked,  Risks and benefits discussed,  Surgical consent,  Pre-op evaluation,  At surgeon's request and post-op pain management  Laterality: Right  Prep: chloraprep       Needles:  Injection technique: Single-shot  Needle Type: Echogenic Stimulator Needle     Needle Length: 9cm  Needle Gauge: 21     Additional Needles:   Procedures:,,,, ultrasound used (permanent image in chart),,    Narrative:  Start time: 01/22/2023 11:25 AM End time: 01/22/2023 11:27 AM Injection made incrementally with aspirations every 5 mL.  Performed by: Personally  Anesthesiologist: Linton Rump, MD  Additional Notes: Discussed risks and benefits of nerve block including, but not limited to, prolonged and/or permanent nerve injury involving sensory and/or motor function. Monitors were applied and a time-out was performed. The nerve and associated structures were visualized under ultrasound guidance. After negative aspiration, local anesthetic was slowly injected around the nerve. There was no evidence of high pressure during the procedure. There were no paresthesias. VSS remained stable and the patient tolerated the procedure well.

## 2023-01-22 NOTE — Op Note (Signed)
Left Breast Radioactive seed localized lumpectomy x 2, right seed localized lumpectomy x 2 and right axillary sentinel lymph node biopsy  Indications: This patient presents with history of bilateral breast cancer,  Left breast cancer cTis +/+ upper central/UOQ  cTis +/+ lower central/LIQ  Right breast cancer cT2N0 invasive lobular carcinoma +/+/- Ki 67 5% LIQ  LCIS upper central right breast  Pre-operative Diagnosis: as above  Post-operative Diagnosis: Same  Surgeon: [name]   Assistant: Patrici Ranks, RNFA  Anesthesia: General endotracheal anesthesia  ASA Class: 3  Procedure Details  The patient was seen in the Holding Room. The risks, benefits, complications, treatment options, and expected outcomes were discussed with the patient. The possibilities of bleeding, infection, the need for additional procedures, failure to diagnose a condition, and creating a complication requiring transfusion or operation were discussed with the patient. The patient concurred with the proposed plan, giving informed consent.  The site of surgery properly noted/marked. The patient was taken to Operating Room # 4, identified, and the procedure verified as Left Breast Seed localized Lumpectomy x 2, right breast seed localized lumpectomy x  with sentinel lymph node biopsy. The bilateral breast, chest, and right arm were prepped and draped in standard fashion. A Time Out was held and the above information confirmed. The MagTrace was injected into the RIGHT subareolar position.  The LEFT side was addressed first.  Both lumpectomies was performed by creating a lateral circumareolar incision near the previously placed radioactive seeds.  Dissection was carried down to around the point of maximum signal intensity in the UOQ/upper central breast. The cautery was used to perform the dissection.  Hemostasis was achieved with cautery.  Additional margins were taken at all borders other than the anterior border which  was skin.  The edges of the cavity were marked with large clips.   The specimen was inked with the margin marker paint kit.    Specimen radiography confirmed inclusion of the mammographic lesion, the clip, and the seed.     The lower lumpectomy was performed similarly.  Additional margins were taken at the medial and lateral margin(s). The anterior and inferior margins were skin.  The wound was irrigated and reinspected for hemostasis.   The background signal in the breast was zero.  The breast tissue was rearranged and mastopexy sutures were placed to minimize the defect.  The skin was then closed with 3-0 vicryl in layers and 4-0 monocryl subcuticular suture.    The right breast was addressed similarly to the left with both lumpectomies being performed via a long lateral circumareolar incision.  The right upper outer lumpectomy was performed first.  No additional margins were taken. Specimen mammogram confirmed the seed and clip.  The lower central lumpectomy was then performed.  Additional margins were taken at the medial, superior, and inferior margins. The anterior and inferior borders are skin, posterior border is pectoralis.   Using a Sentimag probe, right axillary sentinel nodes were identified transcutaneously.  An oblique incision was created below the axillary hairline.  Dissection was carried through the clavipectoral fascia.  One level 1 and one deep level 2 axillary sentinel nodes were removed.  Counts per second were 3500 and 830.    The background count was 0 cps.  The wound was irrigated.  Hemostasis was achieved with cautery.  The axillary incision was closed with a 3-0 vicryl deep dermal interrupted sutures and a 4-0 monocryl subcuticular closure.    Sterile dressings were applied. At the end of the  operation, all sponge, instrument, and needle counts were correct.  Findings: grossly clear surgical margins and no adenopathy, see above for descriptions of margins  Estimated Blood Loss:   min         Specimens:  Left breast lumpectomy upper outer quandrant with radioactive seed  Left breast lumpectomy upper outer quadrant superior margin  Left breast lumpectomy upper outer quadrant inferior margin  Left breast lumpectomy upper outer quadrant posterior margin  Left breast lumpectomy upper outer quadrant medial margin  Left breast lumpectomy upper outer quadrant lateral margin   Left breast lumpectomy central lower with radioactive seed  Left breast lumpectomy central lower inferior margin   Left breast lumpectomy central lower medial margin   Right breast lumpectomy upper outer quadrant with radioactive seed   Right breast lumpectomy lower central with radioactive        Right breast lumpectomy lower - medial margin  Right breast lumpectomy lower - superior margin  Right breast lumpectomy lower - inferior margin   Right axillary SLN #1 Right axillary SLN #2  Complications:  None; patient tolerated the procedure well.         Disposition: PACU - hemodynamically stable.         Condition: stable

## 2023-01-23 ENCOUNTER — Encounter (HOSPITAL_COMMUNITY): Payer: Self-pay | Admitting: General Surgery

## 2023-01-26 NOTE — Anesthesia Postprocedure Evaluation (Signed)
Anesthesia Post Note  Patient: Theresa Duncan  Procedure(s) Performed: RIGHT BREAST LUMPECTOMY WITH RADIOACTIVE SEED X2 AND SENTINEL LYMPH NODE BIOPSY (Right: Breast) LEFT BREAST LUMPECTOMY WITH RADIOACTIVE SEED LOCALIZATION X2 (Left: Breast)     Patient location during evaluation: PACU Anesthesia Type: Regional and General Level of consciousness: awake and alert Pain management: pain level controlled Vital Signs Assessment: post-procedure vital signs reviewed and stable Respiratory status: spontaneous breathing, nonlabored ventilation, respiratory function stable and patient connected to nasal cannula oxygen Cardiovascular status: blood pressure returned to baseline and stable Postop Assessment: no apparent nausea or vomiting Anesthetic complications: no   No notable events documented.  Last Vitals:  Vitals:   01/22/23 1735 01/22/23 1750  BP: (!) 155/85 131/83  Pulse: 84 83  Resp: 16 19  Temp:  36.7 C  SpO2: 96% 97%    Last Pain:  Vitals:   01/22/23 1750  TempSrc:   PainSc: 0-No pain                 Mortimer Bair

## 2023-01-30 LAB — SURGICAL PATHOLOGY

## 2023-02-16 ENCOUNTER — Encounter: Payer: Self-pay | Admitting: *Deleted

## 2023-02-17 NOTE — Therapy (Signed)
OUTPATIENT PHYSICAL THERAPY BREAST CANCER POST OP FOLLOW UP   Patient Name: Theresa Duncan MRN: [id number] DOB:1955/11/30, 67 y.o., female Today's Date: 02/18/2023  END OF SESSION:  PT End of Session - 02/18/23 1511     Visit Number 2    Number of Visits 2    Date for PT Re-Evaluation 03/16/23    PT Start Time 1400    PT Stop Time 1440    PT Time Calculation (min) 40 min    Activity Tolerance Patient tolerated treatment well    Behavior During Therapy WFL for tasks assessed/performed             Past Medical History:  Diagnosis Date   Anxiety    chronic, panic attacks   Atrial fibrillation (HCC) 06/02/2010   treated with Cardiazem and Lovenox at [facility], now [facility]   Cancer [facility])    breast   Colon polyp    tubular adenoma   Hypertension    Hypothyroidism    Migraine    Tachycardia 05/2010   Thyroid disease 05/2010   hypothyroid   Past Surgical History:  Procedure Laterality Date   APPENDECTOMY  1967   BREAST BIOPSY     BREAST BIOPSY Right 11/28/2022   MM RT BREAST BX W LOC DEV 1ST LESION IMAGE BX SPEC STEREO GUIDE 11/28/2022 [facility]   BREAST BIOPSY  01/20/2023   MM RT RADIOACTIVE SEED LOC MAMMO GUIDE 01/20/2023 [facility]   BREAST BIOPSY  01/20/2023   MM RT RADIOACTIVE SEED EA ADD LESION LOC MAMMO GUIDE 01/20/2023 [facility]   BREAST BIOPSY  01/20/2023   MM LT RADIOACTIVE SEED LOC MAMMO GUIDE 01/20/2023 [facility]   BREAST BIOPSY  01/20/2023   MM LT RADIOACTIVE SEED EA ADD LESION LOC MAMMO GUIDE 01/20/2023 [facility]   BREAST LUMPECTOMY Left 10/2017   BREAST LUMPECTOMY WITH RADIOACTIVE SEED AND SENTINEL LYMPH NODE BIOPSY Right 01/22/2023   Procedure: RIGHT BREAST LUMPECTOMY WITH RADIOACTIVE SEED X2 AND SENTINEL LYMPH NODE BIOPSY;  Surgeon: [name], MD;  Location[facility];  Service: General;  Laterality: Right;   BREAST LUMPECTOMY WITH RADIOACTIVE SEED LOCALIZATION Left 11/18/2017   Procedure: LEFT BREAST LUMPECTOMY WITH  RADIOACTIVE SEED LOCALIZATION;  Surgeon: [name], MD;  Location: [facility];  Service: General;  Laterality: Left;   BREAST LUMPECTOMY WITH RADIOACTIVE SEED LOCALIZATION Left 01/22/2023   Procedure: LEFT BREAST LUMPECTOMY WITH RADIOACTIVE SEED LOCALIZATION X2;  Surgeon: [name], MD;  Location: [facility];  Service: General;  Laterality: Left;   HERNIA REPAIR N/A    WISDOM TOOTH EXTRACTION     Patient Active Problem List   Diagnosis Date Noted   Malignant neoplasm of lower-inner quadrant of right breast of female, estrogen receptor positive (HCC) 12/12/2022   Lobular carcinoma in situ (LCIS) of left breast 12/18/2017   Situational anxiety 10/15/2017   Lobular carcinoma of left breast (HCC) 10/13/2017   Palpitations 10/01/2016   Hair loss 07/15/2016   Seborrheic keratoses 07/15/2016   Hyperlipidemia 12/20/2014   Atrial fibrillation (HCC) 05/16/2014   Essential hypertension 05/16/2014   PVCs (premature ventricular contractions) 05/16/2014   Preventative health care 05/16/2014   Chronic anxiety 07/05/2013   Other specified hypothyroidism 07/05/2013   Morbid obesity (HCC) 07/05/2013   Unspecified vitamin D deficiency 07/05/2013   PCP: Dr. Leola Brazil   REFERRING PROVIDER: Dr. Donell Beers   REFERRING DIAG:  Diagnosis  C50.311,Z17.0 (ICD-10-CM) - Malignant neoplasm of lower-inner quadrant of right breast of female, estrogen receptor  positive (HCC    THERAPY DIAG:  Malignant neoplasm of lower-inner quadrant of right breast of female, estrogen receptor positive (HCC)  History of left breast cancer  Abnormal posture  Rationale for Evaluation and Treatment: Rehabilitation  ONSET DATE: 11/28/22  SUBJECTIVE:                                                                                                                                                                                           SUBJECTIVE STATEMENT: I think the Rt one has a bit of fluid in it.  She  drained it and it may need to do it again.  I think my bra was maybe too small. Tenderness under the breast and reaching down to the floor.    PERTINENT HISTORY:  Hx of Lt LCIS in 2018 with lumpectomy and no nodes removed. Now 2 spots on the Rt and 2 spots on the left. (Changed from original findings to bilateral). Underwent Lt lumpectomy x 2 Rt lumpectomy x 2 and Rt SLNB with 2 negative nodes removed. Only the Rt side was cancer. Will do radiation.    PATIENT GOALS:  Reassess how my recovery is going related to arm function, pain, and swelling.  PAIN:  Are you having pain? No not just at rest, but near my Rt nipple with moving around  PRECAUTIONS: Recent Surgery, right UE Lymphedema risk  ACTIVITY LEVEL / LEISURE: back to most - not as much sweeping and mopping.     OBJECTIVE:   PATIENT SURVEYS:  QUICK DASH: 20%  OBSERVATIONS: Wearing normal bra   POSTURE:  Rounded shoulders    LYMPHEDEMA ASSESSMENT:  UPPER EXTREMITY AROM/PROM:   A/PROM RIGHT   eval   02/18/23  Shoulder extension 50 50  Shoulder flexion 165 165  Shoulder abduction 165 165  Shoulder internal rotation     Shoulder external rotation 80 80                          (Blank rows = not tested)   A/PROM LEFT   eval  Shoulder extension 40  Shoulder flexion 155  Shoulder abduction 162  Shoulder internal rotation    Shoulder external rotation 80                          (Blank rows = not tested)   LYMPHEDEMA ASSESSMENTS:    LANDMARK RIGHT   eval 02/18/23  10 cm proximal to olecranon process 37.4 38  Olecranon process 29.5 30  10  cm proximal to ulnar styloid process 24.8 25  Just  proximal to ulnar styloid process 16.7 17  Across hand at thumb web space 18.6 18.6  At base of 2nd digit 6.8 6.8  (Blank rows = not tested)   LANDMARK LEFT   eval 02/18/23  10 cm proximal to olecranon process 38.5   Olecranon process 32.0   10 cm proximal to ulnar styloid process 24.3   Just proximal to ulnar styloid  process 16.5   Across hand at thumb web space 18.7   At base of 2nd digit 6.5   (Blank rows = not tested)   PATIENT EDUCATION:  Education details: post op care below Person educated: Patient Education method: Explanation Education comprehension: verbalized understanding  HOME EXERCISE PROGRAM: Reviewed previously given post op HEP  ASSESSMENT:  CLINICAL IMPRESSION: Pt has regained full ROM and is having minimal pain but seems to have a recurrent seroma in the Rt breast which will be looked at again next week.    Pt will benefit from skilled therapeutic intervention to improve on the following deficits: Decreased knowledge of precautions, impaired UE functional use, pain, decreased ROM, postural dysfunction.   PT treatment/interventions: ADL/Self care home management, Therapeutic exercises, Patient/Family education, Self Care, and Re-evaluation   GOALS: Goals reviewed with patient? Yes  LONG TERM GOALS:  (STG=LTG)  GOALS Name Target Date  Goal status  1 Pt will demonstrate she has regained full shoulder ROM and function post operatively compared to baselines.  Baseline: 02/18/23 MET                    PLAN:  PT FREQUENCY/DURATION: pt may return for breast edema after next visit with Dr. Donell Beers but if not she will be SOZO only  PLAN FOR NEXT SESSION: [facility]  [address], [address]  [place] [address]  [phone]  After Breast Cancer Class It is recommended you attend the ABC class to be educated on lymphedema risk reduction. This class is free of charge and lasts for 1 hour. It is a 1-time class. You will need to download the TEAMS app either on your phone or computer. We will send you a link the night before or the morning of the class. You should be able to click on that link to join the class. This is not a confidential class. You don't have to turn your camera on, but other participants may be able to see your email  address.  Scar massage You can begin gentle scar massage to you incision sites. Gently place one hand on the incision and move the skin (without sliding on the skin) in various directions. Do this for a few minutes and then you can gently massage either coconut oil or vitamin E cream into the scars.  Compression garment You should continue wearing your compression bra until you feel like you no longer have swelling.  Home exercise Program Continue doing the exercises you were given until you feel like you can do them without feeling any tightness at the end.   Walking Program Studies show that 30 minutes of walking per day (fast enough to elevate your heart rate) can significantly reduce the risk of a cancer recurrence. If you can't walk due to other medical reasons, we encourage you to find another activity you could do (like a stationary bike or water exercise).  Posture After breast cancer surgery, people frequently sit with rounded shoulders posture because it puts their incisions on slack and feels better. If you sit  like this and scar tissue forms in that position, you can become very tight and have pain sitting or standing with good posture. Try to be aware of your posture and sit and stand up tall to heal properly.  Follow up PT: It is recommended you return every 3 months for the first 3 years following surgery to be assessed on the SOZO machine for an L-Dex score. This helps prevent clinically significant lymphedema in 95% of patients. These follow up screens are 10 minute appointments that you are not billed for.  Idamae Lusher, PT 02/18/2023, 3:12 PM

## 2023-02-18 ENCOUNTER — Ambulatory Visit: Payer: MEDICARE | Attending: General Surgery | Admitting: Rehabilitation

## 2023-02-18 ENCOUNTER — Encounter: Payer: Self-pay | Admitting: Rehabilitation

## 2023-02-18 DIAGNOSIS — Z170 Estrogen receptor positive status [ER+]: Secondary | ICD-10-CM | POA: Insufficient documentation

## 2023-02-18 DIAGNOSIS — C50311 Malignant neoplasm of lower-inner quadrant of right female breast: Secondary | ICD-10-CM | POA: Insufficient documentation

## 2023-02-18 DIAGNOSIS — R293 Abnormal posture: Secondary | ICD-10-CM | POA: Insufficient documentation

## 2023-02-18 DIAGNOSIS — Z853 Personal history of malignant neoplasm of breast: Secondary | ICD-10-CM | POA: Insufficient documentation

## 2023-02-25 ENCOUNTER — Encounter: Payer: Self-pay | Admitting: Nurse Practitioner

## 2023-02-25 ENCOUNTER — Telehealth: Payer: Self-pay | Admitting: Nurse Practitioner

## 2023-02-25 NOTE — Telephone Encounter (Signed)
Patient is aware of rescheduled appointment

## 2023-03-06 ENCOUNTER — Encounter: Payer: Self-pay | Admitting: *Deleted

## 2023-03-06 DIAGNOSIS — C50311 Malignant neoplasm of lower-inner quadrant of right female breast: Secondary | ICD-10-CM

## 2023-03-24 ENCOUNTER — Encounter: Payer: Self-pay | Admitting: Rehabilitation

## 2023-03-24 ENCOUNTER — Ambulatory Visit: Payer: MEDICARE | Attending: General Surgery | Admitting: Rehabilitation

## 2023-03-24 DIAGNOSIS — I890 Lymphedema, not elsewhere classified: Secondary | ICD-10-CM | POA: Insufficient documentation

## 2023-03-24 DIAGNOSIS — N63 Unspecified lump in unspecified breast: Secondary | ICD-10-CM | POA: Insufficient documentation

## 2023-03-24 DIAGNOSIS — C50311 Malignant neoplasm of lower-inner quadrant of right female breast: Secondary | ICD-10-CM | POA: Insufficient documentation

## 2023-03-24 DIAGNOSIS — Z483 Aftercare following surgery for neoplasm: Secondary | ICD-10-CM | POA: Diagnosis present

## 2023-03-24 DIAGNOSIS — Z853 Personal history of malignant neoplasm of breast: Secondary | ICD-10-CM | POA: Diagnosis present

## 2023-03-24 DIAGNOSIS — Z170 Estrogen receptor positive status [ER+]: Secondary | ICD-10-CM | POA: Insufficient documentation

## 2023-03-24 NOTE — Therapy (Signed)
OUTPATIENT PHYSICAL THERAPY BREAST CANCER RE-EVAL   Patient Name: Theresa Duncan MRN: [id number] DOB:Sep 04, 1956, 67 y.o., female Today's Date: 03/24/2023  END OF SESSION:  PT End of Session - 03/24/23 1510     Visit Number 1    Number of Visits 4    Date for PT Re-Evaluation 04/28/23    PT Start Time 1406    PT Stop Time 1459    PT Time Calculation (min) 53 min    Activity Tolerance Patient tolerated treatment well    Behavior During Therapy WFL for tasks assessed/performed              Past Medical History:  Diagnosis Date   Anxiety    chronic, panic attacks   Atrial fibrillation (HCC) 06/02/2010   treated with Cardiazem and Lovenox at [facility], now [facility]   Cancer [facility])    breast   Colon polyp    tubular adenoma   Hypertension    Hypothyroidism    Migraine    Tachycardia 05/2010   Thyroid disease 05/2010   hypothyroid   Past Surgical History:  Procedure Laterality Date   APPENDECTOMY  1967   BREAST BIOPSY     BREAST BIOPSY Right 11/28/2022   MM RT BREAST BX W LOC DEV 1ST LESION IMAGE BX SPEC STEREO GUIDE 11/28/2022 [facility]   BREAST BIOPSY  01/20/2023   MM RT RADIOACTIVE SEED LOC MAMMO GUIDE 01/20/2023 [facility]   BREAST BIOPSY  01/20/2023   MM RT RADIOACTIVE SEED EA ADD LESION LOC MAMMO GUIDE 01/20/2023 [facility]   BREAST BIOPSY  01/20/2023   MM LT RADIOACTIVE SEED LOC MAMMO GUIDE 01/20/2023 [facility]   BREAST BIOPSY  01/20/2023   MM LT RADIOACTIVE SEED EA ADD LESION LOC MAMMO GUIDE 01/20/2023 [facility]   BREAST LUMPECTOMY Left 10/2017   BREAST LUMPECTOMY WITH RADIOACTIVE SEED AND SENTINEL LYMPH NODE BIOPSY Right 01/22/2023   Procedure: RIGHT BREAST LUMPECTOMY WITH RADIOACTIVE SEED X2 AND SENTINEL LYMPH NODE BIOPSY;  Surgeon: [name], MD;  Location: [facility];  Service: General;  Laterality: Right;   BREAST LUMPECTOMY WITH RADIOACTIVE SEED LOCALIZATION Left 11/18/2017   Procedure: LEFT BREAST LUMPECTOMY WITH RADIOACTIVE  SEED LOCALIZATION;  Surgeon: [name], MD;  Location: [facility];  Service: General;  Laterality: Left;   BREAST LUMPECTOMY WITH RADIOACTIVE SEED LOCALIZATION Left 01/22/2023   Procedure: LEFT BREAST LUMPECTOMY WITH RADIOACTIVE SEED LOCALIZATION X2;  Surgeon: [name], MD;  Location: [facility];  Service: General;  Laterality: Left;   HERNIA REPAIR N/A    WISDOM TOOTH EXTRACTION     Patient Active Problem List   Diagnosis Date Noted   Malignant neoplasm of lower-inner quadrant of right breast of female, estrogen receptor positive (HCC) 12/12/2022   Lobular carcinoma in situ (LCIS) of left breast 12/18/2017   Situational anxiety 10/15/2017   Lobular carcinoma of left breast (HCC) 10/13/2017   Palpitations 10/01/2016   Hair loss 07/15/2016   Seborrheic keratoses 07/15/2016   Hyperlipidemia 12/20/2014   Atrial fibrillation (HCC) 05/16/2014   Essential hypertension 05/16/2014   PVCs (premature ventricular contractions) 05/16/2014   Preventative health care 05/16/2014   Chronic anxiety 07/05/2013   Other specified hypothyroidism 07/05/2013   Morbid obesity (HCC) 07/05/2013   Unspecified vitamin D deficiency 07/05/2013   PCP: Dr. Leola Brazil   REFERRING PROVIDER: Dr. Donell Beers   REFERRING DIAG:  Diagnosis  C50.311,Z17.0 (ICD-10-CM) - Malignant neoplasm of lower-inner quadrant of right breast of female, estrogen receptor positive (HCC  THERAPY DIAG:  Lymphedema, not elsewhere classified  Breast swelling  Malignant neoplasm of lower-inner quadrant of right breast of female, estrogen receptor positive (HCC)  History of left breast cancer  Aftercare following surgery for neoplasm  Rationale for Evaluation and Treatment: Rehabilitation  ONSET DATE: 11/28/22  SUBJECTIVE:                                                                                                                                                                                            SUBJECTIVE STATEMENT: Dr. Donell Beers thinks massage may help my Rt breast - it feels a bit swollen.  There wasn't more to drain.   I had my second radiation today.   PERTINENT HISTORY:  Hx of Lt LCIS in 2018 with lumpectomy and no nodes removed. Now 2 spots on the Rt and 2 spots on the left.  Underwent Lt lumpectomy x 2 Rt lumpectomy x 2 and Rt SLNB with 2 negative nodes removed. Only the Rt side was cancer. Will do radiation bilaterally x 3 weeks.   PATIENT GOALS:  Reassess how my recovery is going related to arm function, pain, and swelling.  PAIN:  Are you having pain? No not just at rest, but near my Rt nipple with moving around  PRECAUTIONS: Recent Surgery, right UE Lymphedema risk  ACTIVITY LEVEL / LEISURE: back to most - not as much sweeping and mopping.    HAND DOMINANCE: right   WEIGHT BEARING RESTRICTIONS: No   FALLS:  Has patient fallen in last 6 months? 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  Rt side a bit larger globally. No fibrosis or peau de orange noted.  Slight redness lateral to nipple on the Rt.   PALPATION: seroma borders no sig fluid evident under Rt lumpectomy incision. No sig fibrosis noted.   POSTURE:  Rounded shoulders    LYMPHEDEMA ASSESSMENT:  UPPER EXTREMITY AROM/PROM:   A/PROM RIGHT   eval   02/18/23 and 03/24/23  Shoulder extension 50 50  Shoulder flexion 165 165  Shoulder abduction 165 165  Shoulder internal rotation  Shoulder external rotation 80 80                           (Blank rows = not tested)   A/PROM LEFT   Eval and 03/24/23  Shoulder extension 40  Shoulder flexion 155  Shoulder abduction 162  Shoulder internal rotation    Shoulder external rotation 80                          (Blank rows = not tested)   LYMPHEDEMA ASSESSMENTS: Undergoing SOZO screens currently.    TODAY"S TREATMENT: Pt permission and consent throughout each step of examination and treatment with modification and draping if requested when working on sensitive areas  03/24/23 Eval performed In supine: Short neck, 5 diaphragmatic breaths, bil axillary nodes and establishment of interaxillary pathway, R inguinal nodes and establishment of axilloinguinal pathway, then R breast moving fluid towards pathways spending extra time in any areas of fibrosis then retracing all steps. Education on lymphatic anatomy and physiology and steps of massage throughout.  Gave self MLD handout   PATIENT EDUCATION:  Education details: self MLD Person educated: Patient Education method: Programmer, multimedia, Manufacturing engineer, vcs. tcs Education comprehension: verbalized understanding  HOME EXERCISE PROGRAM: Reviewed previously given post op HEP Self MLD Rt breast  ASSESSMENT:  CLINICAL IMPRESSION: Pt had a Rt breast seroma and general Rt breast edema.  She just started radiation which occurs in [place].  She continues with  full ROM and is having minimal pain. Breast complaint questionnaire is 9/80 which is just at the cut off of breast lymphedema.  She lives farther away and helps care for her husband so we will do 1x per week or every 2 weeks x 4 weeks.     Pt will benefit from skilled therapeutic intervention to improve on the following deficits: Decreased knowledge of precautions, impaired UE functional use, pain, decreased ROM, postural dysfunction.   PT treatment/interventions: ADL/Self care home management, Therapeutic exercises, Patient/Family education, Self Care, and  Re-evaluation, Manual therapy   GOALS: Goals reviewed with patient? Yes  LONG TERM GOALS:  (STG=LTG)  GOALS Name Target Date  Goal status  1 Pt will demonstrate she has regained full shoulder ROM and function post operatively compared to baselines.  Baseline: 02/18/23 MET  2 Pt will be ind with self MLD 04/28/23 NEW               PLAN:  PT FREQUENCY/DURATION: 1x per week x 4 weeks   PLAN FOR NEXT SESSION: Rt breast MLD with focus on self education,    Idamae Lusher, PT 03/24/2023, 3:10 PM  Manual Lymph Drainage for Right Breast.   Do daily.  Do slowly. Use flat hands with just enough pressure to stretch the skin. Do not slide over the skin, stretch the skin with the hand. (Stretch  Relax  Move) Lie down or sit comfortably (in a recliner, for example) to do this.   Do circles at each collar bone near the neck 5-7 times (to "wake up" lots of lymph nodes in this area).  Take slow deep breaths, allowing your belly to balloon out as you breathe in, 5x (to "wake up" abdominal lymph nodes).  Do circles in both armpits--stretch skin in small circles to stimulate intact lymph nodes there, 5-7x.  Do Circles in the right groin area, at panty line--stretch skin to stimulate lymph nodes 5-7x.  Redirect fluid from right  chest toward left armpit (stretch skin starting at right chest in 3-4 spots working toward left armpit) 3-4x across the chest.  Redirect fluid from right armpit toward right groin (cup your hand around the curve of your right side and do 3-4 "pumps" from armpit to groin) 3-4x down your side.  Draw an imaginary diagonal line from upper outer breast through the nipple area toward lower inner breast.  Direct fluid above this line upward and inward toward the pathway across your chest (established in #5).  Then direct the fluid below this line down and out toward the pathway down your side going towards the left groin (established in #6). Do this for a few minutes or until  you feel any improvements.   Then repeat your pathways 2-3x  (#5 and #6)  End with repeating #3 and #4 above  (circles in both armpits and the left groin)

## 2023-03-26 ENCOUNTER — Encounter: Payer: Self-pay | Admitting: Nurse Practitioner

## 2023-03-30 ENCOUNTER — Ambulatory Visit: Payer: PRIVATE HEALTH INSURANCE | Admitting: Nurse Practitioner

## 2023-03-31 ENCOUNTER — Ambulatory Visit: Payer: MEDICARE | Attending: General Surgery | Admitting: Rehabilitation

## 2023-03-31 ENCOUNTER — Telehealth: Payer: Self-pay | Admitting: Nurse Practitioner

## 2023-03-31 ENCOUNTER — Encounter: Payer: Self-pay | Admitting: Rehabilitation

## 2023-03-31 DIAGNOSIS — I890 Lymphedema, not elsewhere classified: Secondary | ICD-10-CM | POA: Insufficient documentation

## 2023-03-31 DIAGNOSIS — C50311 Malignant neoplasm of lower-inner quadrant of right female breast: Secondary | ICD-10-CM | POA: Insufficient documentation

## 2023-03-31 DIAGNOSIS — Z483 Aftercare following surgery for neoplasm: Secondary | ICD-10-CM | POA: Diagnosis present

## 2023-03-31 DIAGNOSIS — Z853 Personal history of malignant neoplasm of breast: Secondary | ICD-10-CM | POA: Insufficient documentation

## 2023-03-31 DIAGNOSIS — Z170 Estrogen receptor positive status [ER+]: Secondary | ICD-10-CM | POA: Diagnosis present

## 2023-03-31 DIAGNOSIS — N63 Unspecified lump in unspecified breast: Secondary | ICD-10-CM | POA: Insufficient documentation

## 2023-03-31 NOTE — Therapy (Signed)
OUTPATIENT PHYSICAL THERAPY BREAST CANCER TREATMENT   Patient Name: Theresa Duncan MRN: [id number] DOB:[birth year], 67 y.o., female Today's Date: 03/31/2023  END OF SESSION:  PT End of Session - 03/31/23 1358     Visit Number 2    Number of Visits 4    Date for PT Re-Evaluation 04/28/23    PT Start Time 1300    PT Stop Time 1349    PT Time Calculation (min) 49 min    Activity Tolerance Patient tolerated treatment well    Behavior During Therapy WFL for tasks assessed/performed               Past Medical History:  Diagnosis Date   Anxiety    chronic, panic attacks   Atrial fibrillation (HCC) 06/02/2010   treated with Cardiazem and Lovenox at [facility], now [facility]   Cancer [facility])    breast   Colon polyp    tubular adenoma   Hypertension    Hypothyroidism    Migraine    Tachycardia 05/2010   Thyroid disease 05/2010   hypothyroid   Past Surgical History:  Procedure Laterality Date   APPENDECTOMY  1967   BREAST BIOPSY     BREAST BIOPSY Right 11/28/2022   MM RT BREAST BX W LOC DEV 1ST LESION IMAGE BX SPEC STEREO GUIDE 11/28/2022 [facility]   BREAST BIOPSY  01/20/2023   MM RT RADIOACTIVE SEED LOC MAMMO GUIDE 01/20/2023 [facility]   BREAST BIOPSY  01/20/2023   MM RT RADIOACTIVE SEED EA ADD LESION LOC MAMMO GUIDE 01/20/2023 [facility]   BREAST BIOPSY  01/20/2023   MM LT RADIOACTIVE SEED LOC MAMMO GUIDE 01/20/2023 [facility]   BREAST BIOPSY  01/20/2023   MM LT RADIOACTIVE SEED EA ADD LESION LOC MAMMO GUIDE 01/20/2023 [facility]   BREAST LUMPECTOMY Left 10/2017   BREAST LUMPECTOMY WITH RADIOACTIVE SEED AND SENTINEL LYMPH NODE BIOPSY Right 01/22/2023   Procedure: RIGHT BREAST LUMPECTOMY WITH RADIOACTIVE SEED X2 AND SENTINEL LYMPH NODE BIOPSY;  Surgeon: [name], MD;  Location: [facility];  Service: General;  Laterality: Right;   BREAST LUMPECTOMY WITH RADIOACTIVE SEED LOCALIZATION Left 11/18/2017   Procedure: LEFT BREAST LUMPECTOMY WITH  RADIOACTIVE SEED LOCALIZATION;  Surgeon: [name], MD;  Location: [facility];  Service: General;  Laterality: Left;   BREAST LUMPECTOMY WITH RADIOACTIVE SEED LOCALIZATION Left 01/22/2023   Procedure: LEFT BREAST LUMPECTOMY WITH RADIOACTIVE SEED LOCALIZATION X2;  Surgeon: [name], MD;  Location: [facility];  Service: General;  Laterality: Left;   HERNIA REPAIR N/A    WISDOM TOOTH EXTRACTION     Patient Active Problem List   Diagnosis Date Noted   Malignant neoplasm of lower-inner quadrant of right breast of female, estrogen receptor positive (HCC) 12/12/2022   Lobular carcinoma in situ (LCIS) of left breast 12/18/2017   Situational anxiety 10/15/2017   Lobular carcinoma of left breast (HCC) 10/13/2017   Palpitations 10/01/2016   Hair loss 07/15/2016   Seborrheic keratoses 07/15/2016   Hyperlipidemia 12/20/2014   Atrial fibrillation (HCC) 05/16/2014   Essential hypertension 05/16/2014   PVCs (premature ventricular contractions) 05/16/2014   Preventative health care 05/16/2014   Chronic anxiety 07/05/2013   Other specified hypothyroidism 07/05/2013   Morbid obesity (HCC) 07/05/2013   Unspecified vitamin D deficiency 07/05/2013   PCP: Dr. Leola Brazil   REFERRING PROVIDER: Dr. Donell Beers   REFERRING DIAG:  Diagnosis  C50.311,Z17.0 (ICD-10-CM) - Malignant neoplasm of lower-inner quadrant of right breast of female, estrogen receptor positive (  HCC    THERAPY DIAG:  Lymphedema, not elsewhere classified  Breast swelling  Malignant neoplasm of lower-inner quadrant of right breast of female, estrogen receptor positive (HCC)  History of left breast cancer  Aftercare following surgery for neoplasm  Rationale for Evaluation and Treatment: Rehabilitation  ONSET DATE: 11/28/22  SUBJECTIVE:                                                                                                                                                                                            SUBJECTIVE STATEMENT: Getting a little bit red.  I am tired today.    PERTINENT HISTORY:  Hx of Lt LCIS in 2018 with lumpectomy and no nodes removed. Now 2 spots on the Rt and 2 spots on the left.  Underwent Lt lumpectomy x 2 Rt lumpectomy x 2 and Rt SLNB with 2 negative nodes removed. Only the Rt side was cancer. Will do radiation bilaterally x 3 weeks.   PATIENT GOALS:  Reassess how my recovery is going related to arm function, pain, and swelling.  PAIN:  Are you having pain? No not just at rest, but near my Rt nipple with moving around  PRECAUTIONS: Recent Surgery, right UE Lymphedema risk  ACTIVITY LEVEL / LEISURE: back to most - not as much sweeping and mopping.    HAND DOMINANCE: right   WEIGHT BEARING RESTRICTIONS: No   FALLS:  Has patient fallen in last 6 months? 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  Rt side a bit larger globally. No fibrosis or peau de orange noted.  Slight redness lateral to nipple on the Rt.   PALPATION: seroma borders no sig fluid evident under Rt lumpectomy incision. No sig fibrosis noted.   POSTURE:  Rounded shoulders    LYMPHEDEMA ASSESSMENT:  UPPER EXTREMITY AROM/PROM:   A/PROM RIGHT   eval   02/18/23 and 03/24/23  Shoulder extension 50 50  Shoulder flexion 165 165  Shoulder abduction 165 165  Shoulder internal rotation     Shoulder external rotation 80 80                          (  Blank rows = not tested)   A/PROM LEFT   Eval and 03/24/23   Shoulder extension 40  Shoulder flexion 155  Shoulder abduction 162  Shoulder internal rotation    Shoulder external rotation 80                          (Blank rows = not tested)   LYMPHEDEMA ASSESSMENTS: Undergoing SOZO screens currently.    TODAY"S TREATMENT: Pt permission and consent throughout each step of examination and treatment with modification and draping if requested when working on sensitive areas  03/31/23 In supine: Short neck, 5 diaphragmatic breaths, bil axillary nodes and establishment of interaxillary pathway, R inguinal nodes and establishment of axilloinguinal pathway, then R breast moving fluid towards pathways spending extra time in any areas of fibrosis then retracing all steps. Education on lymphatic anatomy and physiology and steps of massage throughout and reviewed steps as needed.   03/24/23 Eval performed In supine: Short neck, 5 diaphragmatic breaths, bil axillary nodes and establishment of interaxillary pathway, R inguinal nodes and establishment of axilloinguinal pathway, then R breast moving fluid towards pathways spending extra time in any areas of fibrosis then retracing all steps. Education on lymphatic anatomy and physiology and steps of massage throughout.  Gave self MLD handout   PATIENT EDUCATION:  Education details: self MLD Person educated: Patient Education method: Programmer, multimedia, Manufacturing engineer, vcs. tcs Education comprehension: verbalized understanding  HOME EXERCISE PROGRAM: Reviewed previously given post op HEP Self MLD Rt breast  ASSESSMENT:  CLINICAL IMPRESSION: Pt is doing well.  Doing her self MLD accurately even after just 1 visit.  MLD continued by PT.      Pt will benefit from skilled therapeutic intervention to improve on the following deficits: Decreased knowledge of precautions, impaired UE functional use, pain, decreased ROM, postural dysfunction.   PT treatment/interventions: ADL/Self care home management, Therapeutic exercises,  Patient/Family education, Self Care, and Re-evaluation, Manual therapy   GOALS: Goals reviewed with patient? Yes  LONG TERM GOALS:  (STG=LTG)  GOALS Name Target Date  Goal status  1 Pt will demonstrate she has regained full shoulder ROM and function post operatively compared to baselines.  Baseline: 02/18/23 MET  2 Pt will be ind with self MLD 04/28/23 NEW               PLAN:  PT FREQUENCY/DURATION: 1x per week x 4 weeks   PLAN FOR NEXT SESSION: Rt breast MLD with focus on self education,    Idamae Lusher, PT 03/31/2023, 1:58 PM  Manual Lymph Drainage for Right Breast.   Do daily.  Do slowly. Use flat hands with just enough pressure to stretch the skin. Do not slide over the skin, stretch the skin with the hand. (Stretch  Relax  Move) Lie down or sit comfortably (in a recliner, for example) to do this.   Do circles at each collar bone near the neck 5-7 times (to "wake up" lots of lymph nodes in this area).  Take slow deep breaths, allowing your belly to balloon out as you breathe in, 5x (to "wake up" abdominal lymph nodes).  Do circles in both armpits--stretch skin in small circles to stimulate intact lymph nodes there, 5-7x.  Do Circles in the right groin area, at panty line--stretch skin to stimulate lymph nodes 5-7x.  Redirect fluid from right chest toward left armpit (stretch skin starting at right chest in 3-4 spots working toward left armpit) 3-4x across the chest.  Redirect fluid from right armpit toward right groin (cup your hand around the curve of your right side and do 3-4 "pumps" from armpit to groin) 3-4x down your side.  Draw an imaginary diagonal line from upper outer breast through the nipple area toward lower inner breast.  Direct fluid above this line upward and inward toward the pathway across your chest (established in #5).  Then direct the fluid below this line down and out toward the pathway down your side going towards the left groin (established in  #6). Do this for a few minutes or until you feel any improvements.   Then repeat your pathways 2-3x  (#5 and #6)  End with repeating #3 and #4 above  (circles in both armpits and the left groin)

## 2023-04-08 ENCOUNTER — Ambulatory Visit: Payer: PRIVATE HEALTH INSURANCE | Admitting: Nurse Practitioner

## 2023-04-09 ENCOUNTER — Encounter: Payer: Self-pay | Admitting: Rehabilitation

## 2023-04-09 ENCOUNTER — Ambulatory Visit: Payer: MEDICARE | Admitting: Rehabilitation

## 2023-04-09 DIAGNOSIS — Z483 Aftercare following surgery for neoplasm: Secondary | ICD-10-CM

## 2023-04-09 DIAGNOSIS — N63 Unspecified lump in unspecified breast: Secondary | ICD-10-CM

## 2023-04-09 DIAGNOSIS — Z853 Personal history of malignant neoplasm of breast: Secondary | ICD-10-CM

## 2023-04-09 DIAGNOSIS — C50311 Malignant neoplasm of lower-inner quadrant of right female breast: Secondary | ICD-10-CM

## 2023-04-09 DIAGNOSIS — I890 Lymphedema, not elsewhere classified: Secondary | ICD-10-CM

## 2023-04-09 NOTE — Therapy (Signed)
OUTPATIENT PHYSICAL THERAPY BREAST CANCER TREATMENT   Patient Name: Theresa Duncan MRN: [id number] DOB:November 15, 1955, 67 y.o., female Today's Date: 04/09/2023  END OF SESSION:  PT End of Session - 04/09/23 1532     Visit Number 3    Number of Visits 4    Date for PT Re-Evaluation 04/28/23    PT Start Time 1500    PT Stop Time 1530    PT Time Calculation (min) 30 min    Activity Tolerance Patient tolerated treatment well    Behavior During Therapy WFL for tasks assessed/performed                Past Medical History:  Diagnosis Date   Anxiety    chronic, panic attacks   Atrial fibrillation (HCC) 06/02/2010   treated with Cardiazem and Lovenox at [facility], now [facility]   Cancer [facility])    breast   Colon polyp    tubular adenoma   Hypertension    Hypothyroidism    Migraine    Tachycardia 05/2010   Thyroid disease 05/2010   hypothyroid   Past Surgical History:  Procedure Laterality Date   APPENDECTOMY  1967   BREAST BIOPSY     BREAST BIOPSY Right 11/28/2022   MM RT BREAST BX W LOC DEV 1ST LESION IMAGE BX SPEC STEREO GUIDE 11/28/2022 [facility]   BREAST BIOPSY  01/20/2023   MM RT RADIOACTIVE SEED LOC MAMMO GUIDE 01/20/2023 [facility]   BREAST BIOPSY  01/20/2023   MM RT RADIOACTIVE SEED EA ADD LESION LOC MAMMO GUIDE 01/20/2023 [facility]   BREAST BIOPSY  01/20/2023   MM LT RADIOACTIVE SEED LOC MAMMO GUIDE 01/20/2023 [facility]   BREAST BIOPSY  01/20/2023   MM LT RADIOACTIVE SEED EA ADD LESION LOC MAMMO GUIDE 01/20/2023 [facility]   BREAST LUMPECTOMY Left 10/2017   BREAST LUMPECTOMY WITH RADIOACTIVE SEED AND SENTINEL LYMPH NODE BIOPSY Right 01/22/2023   Procedure: RIGHT BREAST LUMPECTOMY WITH RADIOACTIVE SEED X2 AND SENTINEL LYMPH NODE BIOPSY;  Surgeon: [name], MD;  Location: [facility];  Service: General;  Laterality: Right;   BREAST LUMPECTOMY WITH RADIOACTIVE SEED LOCALIZATION Left 11/18/2017   Procedure: LEFT BREAST LUMPECTOMY WITH  RADIOACTIVE SEED LOCALIZATION;  Surgeon: [name], MD;  Location:  [facility]ERY [facility];  Service: General;  Laterality: Left;   BREAST LUMPECTOMY WITH RADIOACTIVE SEED LOCALIZATION Left 01/22/2023   Procedure: LEFT BREAST LUMPECTOMY WITH RADIOACTIVE SEED LOCALIZATION X2;  Surgeon: [name], MD;  Location: [facility];  Service: General;  Laterality: Left;   HERNIA REPAIR N/A    WISDOM TOOTH EXTRACTION     Patient Active Problem List   Diagnosis Date Noted   Malignant neoplasm of lower-inner quadrant of right breast of female, estrogen receptor positive (HCC) 12/12/2022   Lobular carcinoma in situ (LCIS) of left breast 12/18/2017   Situational anxiety 10/15/2017   Lobular carcinoma of left breast (HCC) 10/13/2017   Palpitations 10/01/2016   Hair loss 07/15/2016   Seborrheic keratoses 07/15/2016   Hyperlipidemia 12/20/2014   Atrial fibrillation (HCC) 05/16/2014   Essential hypertension 05/16/2014   PVCs (premature ventricular contractions) 05/16/2014   Preventative health care 05/16/2014   Chronic anxiety 07/05/2013   Other specified hypothyroidism 07/05/2013   Morbid obesity (HCC) 07/05/2013   Unspecified vitamin D deficiency 07/05/2013   PCP: Dr. Leola Brazil   REFERRING PROVIDER: Dr. Donell Beers   REFERRING DIAG:  Diagnosis  C50.311,Z17.0 (ICD-10-CM) - Malignant neoplasm of lower-inner quadrant of right breast of female, estrogen receptor  positive (HCC    THERAPY DIAG:  Lymphedema, not elsewhere classified  Breast swelling  Malignant neoplasm of lower-inner quadrant of right breast of female, estrogen receptor positive (HCC)  History of left breast cancer  Aftercare following surgery for neoplasm  Rationale for Evaluation and Treatment: Rehabilitation  ONSET DATE: 11/28/22  SUBJECTIVE:                                                                                                                                                                                            SUBJECTIVE STATEMENT:  I'm starting to get a little tender but I only have 3 more!  PERTINENT HISTORY:  Hx of Lt LCIS in 2018 with lumpectomy and no nodes removed. Now 2 spots on the Rt and 2 spots on the left.  Underwent Lt lumpectomy x 2 Rt lumpectomy x 2 and Rt SLNB with 2 negative nodes removed. Only the Rt side was cancer. Will do radiation bilaterally x 3 weeks.   PATIENT GOALS:  Reassess how my recovery is going related to arm function, pain, and swelling.  PAIN:  Are you having pain? No not just at rest, but near my Rt nipple with moving around  PRECAUTIONS: Recent Surgery, right UE Lymphedema risk  ACTIVITY LEVEL / LEISURE: back to most - not as much sweeping and mopping.    HAND DOMINANCE: right   WEIGHT BEARING RESTRICTIONS: No   FALLS:  Has patient fallen in last 6 months? 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  Rt side a bit larger globally. No fibrosis or peau de orange noted.  Slight redness lateral to nipple on the Rt.   PALPATION: seroma borders no sig fluid evident under Rt lumpectomy incision. No sig fibrosis noted.   POSTURE:  Rounded shoulders    LYMPHEDEMA ASSESSMENT:  UPPER EXTREMITY AROM/PROM:   A/PROM RIGHT   eval   02/18/23 and 03/24/23  Shoulder extension 50 50  Shoulder flexion 165 165  Shoulder abduction 165 165  Shoulder internal rotation     Shoulder external rotation 80 80                          (  Blank rows = not tested)   A/PROM LEFT   Eval  and 03/24/23  Shoulder extension 40  Shoulder flexion 155  Shoulder abduction 162  Shoulder internal rotation    Shoulder external rotation 80                          (Blank rows = not tested)   LYMPHEDEMA ASSESSMENTS: Undergoing SOZO screens currently.    TODAY"S TREATMENT: Pt permission and consent throughout each step of examination and treatment with modification and draping if requested when working on sensitive areas  04/09/23 In supine: Short neck, superficial and deep abdominals, 5 diaphragmatic breaths, bil axillary nodes and establishment of interaxillary pathway, R inguinal nodes and establishment of axilloinguinal pathway, then as close to Rt breast avoiding the radiation field at this time moving fluid towards pathways.   03/31/23 In supine: Short neck, 5 diaphragmatic breaths, bil axillary nodes and establishment of interaxillary pathway, R inguinal nodes and establishment of axilloinguinal pathway, then R breast moving fluid towards pathways spending extra time in any areas of fibrosis then retracing all steps. Education on lymphatic anatomy and physiology and steps of massage throughout and reviewed steps as needed.   03/24/23 Eval performed In supine: Short neck, 5 diaphragmatic breaths, bil axillary nodes and establishment of interaxillary pathway, R inguinal nodes and establishment of axilloinguinal pathway, then R breast moving fluid towards pathways spending extra time in any areas of fibrosis then retracing all steps. Education on lymphatic anatomy and physiology and steps of massage throughout.  Gave self MLD handout   PATIENT EDUCATION:  Education details: self MLD Person educated: Patient Education method: Programmer, multimedia, Manufacturing engineer, vcs. tcs Education comprehension: verbalized understanding  HOME EXERCISE PROGRAM: Reviewed previously given post op HEP Self MLD Rt breast  ASSESSMENT:  CLINICAL IMPRESSION: Pt is doing well.  MLD limited today to the surrounding area  but not on the breast due to redness and tenderness now finishing radiation.  Cancelled the next appointment and will skip to the week after for redness.    Pt will benefit from skilled therapeutic intervention to improve on the following deficits: Decreased knowledge of precautions, impaired UE functional use, pain, decreased ROM, postural dysfunction.   PT treatment/interventions: ADL/Self care home management, Therapeutic exercises, Patient/Family education, Self Care, and Re-evaluation, Manual therapy   GOALS: Goals reviewed with patient? Yes  LONG TERM GOALS:  (STG=LTG)  GOALS Name Target Date  Goal status  1 Pt will demonstrate she has regained full shoulder ROM and function post operatively compared to baselines.  Baseline: 02/18/23 MET  2 Pt will be ind with self MLD 04/28/23 NEW               PLAN:  PT FREQUENCY/DURATION: 1x per week x 4 weeks   PLAN FOR NEXT SESSION: Rt breast MLD with focus on self education,    Idamae Lusher, PT 04/09/2023, 3:41 PM  Manual Lymph Drainage for Right Breast.   Do daily.  Do slowly. Use flat hands with just enough pressure to stretch the skin. Do not slide over the skin, stretch the skin with the hand. (Stretch  Relax  Move) Lie down or sit comfortably (in a recliner, for example) to do this.   Do circles at each collar bone near the neck 5-7 times (to "wake up" lots of lymph nodes in this area).  Take slow deep breaths, allowing your belly to balloon out as you breathe in, 5x (to "wake  up" abdominal lymph nodes).  Do circles in both armpits--stretch skin in small circles to stimulate intact lymph nodes there, 5-7x.  Do Circles in the right groin area, at panty line--stretch skin to stimulate lymph nodes 5-7x.  Redirect fluid from right chest toward left armpit (stretch skin starting at right chest in 3-4 spots working toward left armpit) 3-4x across the chest.  Redirect fluid from right armpit toward right groin (cup your hand  around the curve of your right side and do 3-4 "pumps" from armpit to groin) 3-4x down your side.  Draw an imaginary diagonal line from upper outer breast through the nipple area toward lower inner breast.  Direct fluid above this line upward and inward toward the pathway across your chest (established in #5).  Then direct the fluid below this line down and out toward the pathway down your side going towards the left groin (established in #6). Do this for a few minutes or until you feel any improvements.   Then repeat your pathways 2-3x  (#5 and #6)  End with repeating #3 and #4 above  (circles in both armpits and the left groin)

## 2023-04-16 ENCOUNTER — Encounter: Payer: MEDICARE | Admitting: Rehabilitation

## 2023-04-21 ENCOUNTER — Encounter: Payer: Self-pay | Admitting: Nurse Practitioner

## 2023-04-22 ENCOUNTER — Encounter: Payer: Self-pay | Admitting: Nurse Practitioner

## 2023-04-22 ENCOUNTER — Inpatient Hospital Stay: Payer: MEDICARE | Attending: Nurse Practitioner | Admitting: Nurse Practitioner

## 2023-04-22 ENCOUNTER — Ambulatory Visit: Payer: MEDICARE | Admitting: Rehabilitation

## 2023-04-22 ENCOUNTER — Inpatient Hospital Stay: Payer: MEDICARE

## 2023-04-22 ENCOUNTER — Other Ambulatory Visit: Payer: Self-pay

## 2023-04-22 VITALS — BP 126/62 | HR 65 | Temp 97.9°F | Resp 17 | Wt 210.6 lb

## 2023-04-22 DIAGNOSIS — I4891 Unspecified atrial fibrillation: Secondary | ICD-10-CM | POA: Diagnosis not present

## 2023-04-22 DIAGNOSIS — D0512 Intraductal carcinoma in situ of left breast: Secondary | ICD-10-CM | POA: Diagnosis not present

## 2023-04-22 DIAGNOSIS — D0502 Lobular carcinoma in situ of left breast: Secondary | ICD-10-CM

## 2023-04-22 DIAGNOSIS — Z170 Estrogen receptor positive status [ER+]: Secondary | ICD-10-CM | POA: Diagnosis not present

## 2023-04-22 DIAGNOSIS — Z923 Personal history of irradiation: Secondary | ICD-10-CM | POA: Insufficient documentation

## 2023-04-22 DIAGNOSIS — C50311 Malignant neoplasm of lower-inner quadrant of right female breast: Secondary | ICD-10-CM | POA: Diagnosis not present

## 2023-04-22 LAB — CMP (CANCER CENTER ONLY)
ALT: 18 U/L (ref 0–44)
AST: 18 U/L (ref 15–41)
Albumin: 4.4 g/dL (ref 3.5–5.0)
Alkaline Phosphatase: 64 U/L (ref 38–126)
Anion gap: 8 (ref 5–15)
BUN: 14 mg/dL (ref 8–23)
CO2: 26 mmol/L (ref 22–32)
Calcium: 10 mg/dL (ref 8.9–10.3)
Chloride: 105 mmol/L (ref 98–111)
Creatinine: 0.67 mg/dL (ref 0.44–1.00)
GFR, Estimated: >60 mL/min (ref >60)
Glucose, Bld: 99 mg/dL (ref 70–99)
Potassium: 4.4 mmol/L (ref 3.5–5.1)
Sodium: 139 mmol/L (ref 135–145)
Total Bilirubin: 0.4 mg/dL (ref 0.3–1.2)
Total Protein: 7.4 g/dL (ref 6.5–8.1)

## 2023-04-22 LAB — CBC WITH DIFFERENTIAL (CANCER CENTER ONLY)
Abs Immature Granulocytes: 0.02 10*3/uL (ref 0.00–0.07)
Basophils Absolute: 0.1 10*3/uL (ref 0.0–0.1)
Basophils Relative: 1 %
Eosinophils Absolute: 0.2 10*3/uL (ref 0.0–0.5)
Eosinophils Relative: 4 %
HCT: 40.7 % (ref 36.0–46.0)
Hemoglobin: 14.2 g/dL (ref 12.0–15.0)
Immature Granulocytes: 0 %
Lymphocytes Relative: 19 %
Lymphs Abs: 1.1 10*3/uL (ref 0.7–4.0)
MCH: 32.1 pg (ref 26.0–34.0)
MCHC: 34.9 g/dL (ref 30.0–36.0)
MCV: 91.9 fL (ref 80.0–100.0)
Monocytes Absolute: 0.7 10*3/uL (ref 0.1–1.0)
Monocytes Relative: 11 %
Neutro Abs: 3.7 10*3/uL (ref 1.7–7.7)
Neutrophils Relative %: 65 %
Platelet Count: 190 10*3/uL (ref 150–400)
RBC: 4.43 MIL/uL (ref 3.87–5.11)
RDW: 13.1 % (ref 11.5–15.5)
WBC Count: 5.7 10*3/uL (ref 4.0–10.5)
nRBC: 0 % (ref 0.0–0.2)

## 2023-04-22 NOTE — Progress Notes (Addendum)
Patient Care Team: Angelica Chessman, MD as PCP - General (Family Medicine) [name], MD as Consulting Physician (General Surgery) Malachy Mood, MD as Consulting Physician (Hematology)   CHIEF COMPLAINT: Follow up right breast cancer, and h/o left breast LCIS  Oncology History Overview Note  Cancer Staging No matching staging information was found for the patient.      Lobular carcinoma in situ (LCIS) of left breast  09/15/2017 Mammogram   Mammogram 09/15/17 IMPRESSION: Suspicious distortion at 12 o'clock in the left breast.   RECOMMENDATION: Recommend stereotactic biopsy of the left breast distortion    10/02/2017 Initial Biopsy   Diagnosis 10/02/17 Breast, left, needle core biopsy, UOQ at anterior depth - MAMMARY CARCINOMA IN-SITU ARISING IN A COMPLEX SCLEROSING LESION - ATYPICAL LOBULAR HYPERPLASIA - CALCIFICATIONS - SEE COMMENT   11/18/2017 Surgery   LEFT BREAST LUMPECTOMY WITH RADIOACTIVE SEED LOCALIZATION by Dr Donell Beers 11/18/17    11/18/2017 Pathology Results   Diagnosis 11/18/17 Breast, lumpectomy, Left - LOBULAR CARCINOMA IN-SITU ARISING IN A COMPLEX SCLEROSING LESION - INTRADUCTAL PAPILLOMA - PREVIOUS BIOPSY SITE CHANGES - CALCIFICATIONS - SEE COMMENT   12/18/2017 Initial Diagnosis   Lobular carcinoma in situ (LCIS) of left breast    Anti-estrogen oral therapy   She declined antiestrogen therapy.       CURRENT THERAPY: S/p lumpectomy and is currently on adjuvant radiation; PENDING anti-estrogen therapy   INTERVAL HISTORY Ms. Ricco returns for follow up. Last seen by me 12/11/22. MRI showed additional masses, bx 12/24/22 showed LCIS and contralateral DCIS. She underwent b/l lumpectomies and R ax SLNB. Final path showed no invasive carcinoma. She developed post op seromas and required antibiotics. She completed adjuvant b/l breast radiation at [facility] closer to home 03/23/23 - 04/14/23. She developed erythema and dermatitis towards the end of treatment. She is  applying topicals per rad onc. Overall she is feeling positive and looking towards the future, and motivated to take care of herself. Here to discuss anti-estrogen therapy. Notes she has had periods of Afib, does not want to take anything that might increase her risk of thrombosis.     ROS  All other systems reviewed and negative  Past Medical History:  Diagnosis Date   Anxiety    chronic, panic attacks   Atrial fibrillation (HCC) 06/02/2010   treated with Cardiazem and Lovenox at [facility], now ASA   Cancer [facility])    breast   Colon polyp    tubular adenoma   Hypertension    Hypothyroidism    Migraine    Tachycardia 05/2010   Thyroid disease 05/2010   hypothyroid     Past Surgical History:  Procedure Laterality Date   APPENDECTOMY  1967   BREAST BIOPSY     BREAST BIOPSY Right 11/28/2022   MM RT BREAST BX W LOC DEV 1ST LESION IMAGE BX SPEC STEREO GUIDE 11/28/2022 [facility]   BREAST BIOPSY  01/20/2023   MM RT RADIOACTIVE SEED LOC MAMMO GUIDE 01/20/2023 [facility]   BREAST BIOPSY  01/20/2023   MM RT RADIOACTIVE SEED EA ADD LESION LOC MAMMO GUIDE 01/20/2023 [facility]   BREAST BIOPSY  01/20/2023   MM LT RADIOACTIVE SEED LOC MAMMO GUIDE 01/20/2023 [facility]   BREAST BIOPSY  01/20/2023   MM LT RADIOACTIVE SEED EA ADD LESION LOC MAMMO GUIDE 01/20/2023 [facility]   BREAST LUMPECTOMY Left 10/2017   BREAST LUMPECTOMY WITH RADIOACTIVE SEED AND SENTINEL LYMPH NODE BIOPSY Right 01/22/2023   Procedure: RIGHT BREAST LUMPECTOMY WITH  RADIOACTIVE SEED X2 AND SENTINEL LYMPH NODE BIOPSY;  Surgeon: [name], MD;  Location: [facility];  Service: General;  Laterality: Right;   BREAST LUMPECTOMY WITH RADIOACTIVE SEED LOCALIZATION Left 11/18/2017   Procedure: LEFT BREAST LUMPECTOMY WITH RADIOACTIVE SEED LOCALIZATION;  Surgeon: [name], MD;  Location: [facility];  Service: General;  Laterality: Left;   BREAST LUMPECTOMY WITH RADIOACTIVE SEED  LOCALIZATION Left 01/22/2023   Procedure: LEFT BREAST LUMPECTOMY WITH RADIOACTIVE SEED LOCALIZATION X2;  Surgeon: [name], MD;  Location: [facility];  Service: General;  Laterality: Left;   HERNIA REPAIR N/A    WISDOM TOOTH EXTRACTION       Outpatient Encounter Medications as of 04/22/2023  Medication Sig   acetaminophen (TYLENOL) 500 MG tablet Take 1,000 mg by mouth every 6 (six) hours as needed for moderate pain or mild pain.   ALPRAZolam (XANAX) 0.25 MG tablet Take 0.25 mg by mouth daily as needed for anxiety.   aspirin EC 81 MG tablet Take 81 mg by mouth daily.   Cholecalciferol (VITAMIN D3) 2000 UNITS TABS Take 2,000 Units by mouth daily.   diltiazem (CARDIZEM CD) 180 MG 24 hr capsule Take 180 mg by mouth daily.   flecainide (TAMBOCOR) 100 MG tablet Take 50-100 mg by mouth See admin instructions. Take 100 mg in the am and 50 mg at bedtime   GLUCOSAMINE SULFATE PO Take 1,500 mg by mouth daily.   ibuprofen (ADVIL,MOTRIN) 200 MG tablet Take 200 mg by mouth every 6 (six) hours as needed for pain.   levothyroxine (SYNTHROID) 112 MCG tablet Take 112 mcg by mouth daily before breakfast.   losartan (COZAAR) 25 MG tablet Take 25 mg by mouth daily.   meclizine (ANTIVERT) 25 MG tablet Take 25 mg by mouth 3 (three) times daily as needed for dizziness.   Multiple Vitamin (MULTIVITAMIN) tablet Take 1 tablet by mouth daily.   rosuvastatin (CRESTOR) 5 MG tablet Take 5 mg by mouth daily.   trolamine salicylate (ASPERCREME) 10 % cream Apply 1 Application topically daily as needed (lower back pain).   valACYclovir (VALTREX) 500 MG tablet Take 500 mg by mouth daily as needed (Flare-up).   [DISCONTINUED] HYDROcodone-acetaminophen (NORCO/VICODIN) 5-325 MG tablet Take 1 tablet by mouth every 6 (six) hours as needed for moderate pain.   No facility-administered encounter medications on file as of 04/22/2023.     Today's Vitals   04/22/23 1100  BP: 126/62  Pulse: 65  Resp: 17  Temp: 97.9 F (36.6 C)   TempSrc: Oral  SpO2: 99%  Weight: 210 lb 9.6 oz (95.5 kg)   Body mass index is 41.13 kg/m.   PHYSICAL EXAM GENERAL:alert, no distress and comfortable  EYES: sclera clear NECK: without mass LYMPH:  no palpable cervical or supraclavicular lymphadenopathy  LUNGS:  normal breathing effort HEART: no lower extremity edema NEURO: alert & oriented x 3 with fluent speech Breast exam: no nipple discharge or inversion. S/p bilateral lumpectomies, incisions completely healed. Moderate erythema and dermatitis to breasts and chest wall with central breast firmness c/w lymphedema and/or seroma    CBC    Component Value Date/Time   WBC 5.7 04/22/2023 1030   WBC 9.4 01/14/2023 1239   RBC 4.43 04/22/2023 1030   HGB 14.2 04/22/2023 1030   HCT 40.7 04/22/2023 1030   PLT 190 04/22/2023 1030   MCV 91.9 04/22/2023 1030   MCH 32.1 04/22/2023 1030   MCHC 34.9 04/22/2023 1030   RDW 13.1 04/22/2023 1030   LYMPHSABS 1.1  04/22/2023 1030   MONOABS 0.7 04/22/2023 1030   EOSABS 0.2 04/22/2023 1030   BASOSABS 0.1 04/22/2023 1030     CMP     Component Value Date/Time   NA 139 04/22/2023 1030   NA 141 01/12/2012 0000   K 4.4 04/22/2023 1030   CL 105 04/22/2023 1030   CO2 26 04/22/2023 1030   GLUCOSE 99 04/22/2023 1030   BUN 14 04/22/2023 1030   BUN 12 01/12/2012 0000   CREATININE 0.67 04/22/2023 1030   CALCIUM 10.0 04/22/2023 1030   PROT 7.4 04/22/2023 1030   ALBUMIN 4.4 04/22/2023 1030   AST 18 04/22/2023 1030   ALT 18 04/22/2023 1030   ALKPHOS 64 04/22/2023 1030   BILITOT 0.4 04/22/2023 1030   GFRNONAA >60 04/22/2023 1030     ASSESSMENT & PLAN:Belina D Shannahan is a 67 y.o. female with     Right breast ILC and LCIS, G2, stage IA (pT1aN0), ER/PR + HER2 - Ki67 5%, cT1a;  -Screening detected R breast distortion and asymmetry spanning 1.6 cm in the lower inner quadrant, biopsy 11/28/22 revealed invasive lobular carcinoma and intermediate grade LCIS.  Lymphovascular invasion was not identified.   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 Any adjuvant therapy is preventative/to reduce recurrence risk -She completed b/l breast radiation 04/15/23 and is recovering.  -Given her extensive history and that the component of invasive lobular carcinoma on biopsy was strongly ER and PR positive, we do recommend adjuvant aromatase inhibitor to reduce her risk of cancer recurrence,  The potential benefit and side effects, which includes but not limited to, hot flash, skin and vaginal dryness, metabolic changes ( increased blood glucose, cholesterol, weight, etc.), slightly in increased risk of cardiovascular disease, cataracts, muscular and joint discomfort, osteopenia and osteoporosis, etc, were discussed with her in great details. She is interested,  -Due to her concern for thrombosis in the setting of Afib, will reserve Tamoxifen as last resort. I recommend Anastrozole. She agrees to try -Will start in 3-4 weeks, after she recovers from RT.  -F/up in 4 months for surveillance and AI toxicity check -Mammogram due in January 2025 -Pt seen with Dr. Mosetta Putt  2. Lobular Carcinoma in-situ of the Left Breast, new DCIS 11/2022  -Diagnosed in 09/2017. S/p left breast lumpectomy. She had complete resection and no invasive cancer.  -She declined chemoprevention with anti-estrogen therapy. -She has been on surveillance with annual mammograms. Additional screening breast MRI was recommended, but she  declined due to claustrophobia.  -Pre-op MRI showed additional masses, biopsy 12/24/22 showed multifocal (x2) DCIS with calcifications and necrosis -S/p bilateral lumpectomy 01/22/23, left breast path showed focal ADH, ALH and LCIS, no invasive carcinoma  -She completed bilateral radiation at [facility] 04/15/23, she tolerated with expected dermatitis which she is managing -See #1 for anti-estrogen    3. Bone health -continue calcium and vitamin D -We recommend baseline DEXA as she begins AI     PLAN: -Surgical path reviewed -Completed adjuvant radiation at [facility] 7/17,  recovering -Baseline DEXA ~2 weeks -Begin adjuvant Anastrozole as long as she does not have severe osteopenia/osteoporosis on DEXA in 04/2023 once she heals  -F/up in 4 months  -Pt seen with Dr. Mosetta Putt    Orders Placed This Encounter  Procedures   DG Bone Density    Standing Status:   Future    Standing Expiration Date:   04/21/2024    Order Specific Question:   Reason for Exam (SYMPTOM  OR DIAGNOSIS REQUIRED)    Answer:   baseline, starting AI    Order Specific Question:   Preferred imaging location?    Answer:   [facility]   MM DIAG BREAST TOMO BILATERAL    Standing Status:   Future    Standing Expiration Date:   04/21/2024    Order Specific Question:   Reason for Exam (SYMPTOM  OR DIAGNOSIS REQUIRED)    Answer:   h/o LCIS, ILC, DCIS s/p bilateral lumpectomy 12/2022 and rad onc 03/2023    Order Specific Question:   Preferred imaging location?    Answer:   [facility]      All questions were answered. The patient knows to call the clinic with any problems, questions or concerns. No barriers to learning were detected.   Santiago Glad, NP-C 04/22/2023  Addendum I have seen the patient, examined her. I agree with the assessment and and plan and have edited the notes.   I reviewed her pathology findings from her initial biopsy and surgery.  Based on the 4 biopsy results, she has had pT1aN0 invasive lobular  carcinoma in the right breast, and DCIS in the left breast, and LCIS in both breasts.  She has completed adjuvant radiation to bilateral breasts.  I discussed she has a very small risk of metastatic recurrence from her right breast cancer, she is also at increased risk of future breast cancer due to DCIS and LCIS.  I recommend her to consider adjuvant antiestrogen therapy for 5 years.  After lengthy discussion, she agreed with anastrozole, we will call him today.  Potential side effects and management are reviewed with her in detail.  She will call us if she has any significant side effect, and I will see her back in 3 months.  All questions were answered.  Malachy Mood MD 04/22/2023

## 2023-05-08 ENCOUNTER — Ambulatory Visit: Payer: MEDICARE | Admitting: Rehabilitation

## 2023-05-22 ENCOUNTER — Ambulatory Visit: Payer: MEDICARE | Attending: General Surgery | Admitting: Rehabilitation

## 2023-05-22 ENCOUNTER — Encounter: Payer: Self-pay | Admitting: Rehabilitation

## 2023-05-22 DIAGNOSIS — C50311 Malignant neoplasm of lower-inner quadrant of right female breast: Secondary | ICD-10-CM | POA: Diagnosis present

## 2023-05-22 DIAGNOSIS — Z853 Personal history of malignant neoplasm of breast: Secondary | ICD-10-CM | POA: Insufficient documentation

## 2023-05-22 DIAGNOSIS — R293 Abnormal posture: Secondary | ICD-10-CM | POA: Diagnosis present

## 2023-05-22 DIAGNOSIS — I890 Lymphedema, not elsewhere classified: Secondary | ICD-10-CM | POA: Insufficient documentation

## 2023-05-22 DIAGNOSIS — Z170 Estrogen receptor positive status [ER+]: Secondary | ICD-10-CM | POA: Insufficient documentation

## 2023-05-22 DIAGNOSIS — N63 Unspecified lump in unspecified breast: Secondary | ICD-10-CM | POA: Insufficient documentation

## 2023-05-22 DIAGNOSIS — Z483 Aftercare following surgery for neoplasm: Secondary | ICD-10-CM | POA: Insufficient documentation

## 2023-05-22 NOTE — Therapy (Signed)
OUTPATIENT PHYSICAL THERAPY BREAST CANCER TREATMENT   Patient Name: Theresa Duncan MRN: [id number] DOB:01/17/1956, 67 y.o., female Today's Date: 05/22/2023  END OF SESSION:  PT End of Session - 05/22/23 1045     Visit Number 4    Number of Visits 4    PT Start Time 1000    PT Stop Time 1044    PT Time Calculation (min) 44 min    Activity Tolerance Patient tolerated treatment well    Behavior During Therapy WFL for tasks assessed/performed                 Past Medical History:  Diagnosis Date   Anxiety    chronic, panic attacks   Atrial fibrillation (HCC) 06/02/2010   treated with Cardiazem and Lovenox at [facility], now ASA   Cancer [facility])    breast   Colon polyp    tubular adenoma   Hypertension    Hypothyroidism    Migraine    Tachycardia 05/2010   Thyroid disease 05/2010   hypothyroid   Past Surgical History:  Procedure Laterality Date   APPENDECTOMY  1967   BREAST BIOPSY     BREAST BIOPSY Right 11/28/2022   MM RT BREAST BX W LOC DEV 1ST LESION IMAGE BX SPEC STEREO GUIDE 11/28/2022 [facility]   BREAST BIOPSY  01/20/2023   MM RT RADIOACTIVE SEED LOC MAMMO GUIDE 01/20/2023 [facility]   BREAST BIOPSY  01/20/2023   MM RT RADIOACTIVE SEED EA ADD LESION LOC MAMMO GUIDE 01/20/2023 [facility]   BREAST BIOPSY  01/20/2023   MM LT RADIOACTIVE SEED LOC MAMMO GUIDE 01/20/2023 [facility]   BREAST BIOPSY  01/20/2023   MM LT RADIOACTIVE SEED EA ADD LESION LOC MAMMO GUIDE 01/20/2023 [facility]   BREAST LUMPECTOMY Left 10/2017   BREAST LUMPECTOMY WITH RADIOACTIVE SEED AND SENTINEL LYMPH NODE BIOPSY Right 01/22/2023   Procedure: RIGHT BREAST LUMPECTOMY WITH RADIOACTIVE SEED X2 AND SENTINEL LYMPH NODE BIOPSY;  Surgeon: [name], MD;  Location: [facility];  Service: General;  Laterality: Right;   BREAST LUMPECTOMY WITH RADIOACTIVE SEED LOCALIZATION Left 11/18/2017   Procedure: LEFT BREAST LUMPECTOMY WITH RADIOACTIVE SEED LOCALIZATION;  Surgeon:  [name], MD;  Location: [facility];  Service: General;  Laterality: Left;   BREAST LUMPECTOMY WITH RADIOACTIVE SEED LOCALIZATION Left 01/22/2023   Procedure: LEFT BREAST LUMPECTOMY WITH RADIOACTIVE SEED LOCALIZATION X2;  Surgeon: [name], MD;  Location: [facility];  Service: General;  Laterality: Left;   HERNIA REPAIR N/A    WISDOM TOOTH EXTRACTION     Patient Active Problem List   Diagnosis Date Noted   Malignant neoplasm of lower-inner quadrant of right breast of female, estrogen receptor positive (HCC) 12/12/2022   Lobular carcinoma in situ (LCIS) of left breast 12/18/2017   Situational anxiety 10/15/2017   Lobular carcinoma of left breast (HCC) 10/13/2017   Palpitations 10/01/2016   Hair loss 07/15/2016   Seborrheic keratoses 07/15/2016   Hyperlipidemia 12/20/2014   Atrial fibrillation (HCC) 05/16/2014   Essential hypertension 05/16/2014   PVCs (premature ventricular contractions) 05/16/2014   Preventative health care 05/16/2014   Chronic anxiety 07/05/2013   Other specified hypothyroidism 07/05/2013   Morbid obesity (HCC) 07/05/2013   Unspecified vitamin D deficiency 07/05/2013   PCP: Dr. Leola Brazil   REFERRING PROVIDER: Dr. Donell Beers   REFERRING DIAG:  Diagnosis  C50.311,Z17.0 (ICD-10-CM) - Malignant neoplasm of lower-inner quadrant of right breast of female, estrogen receptor positive (HCC    THERAPY DIAG:  Lymphedema, not elsewhere classified  Breast swelling  Malignant neoplasm of lower-inner quadrant of right breast of female, estrogen receptor positive (HCC)  History of left breast cancer  Aftercare following surgery for neoplasm  Abnormal posture  Rationale for Evaluation and Treatment: Rehabilitation  ONSET DATE: 11/28/22  SUBJECTIVE:                                                                                                                                                                                           SUBJECTIVE  STATEMENT:  My skin got bad underneath the breast and then in both armpits.  Today is the first day I have had a camisole one.  This week I am finally feeling better.    PERTINENT HISTORY:  Hx of Lt LCIS in 2018 with lumpectomy and no nodes removed. Now 2 spots on the Rt and 2 spots on the left.  Underwent Lt lumpectomy x 2 Rt lumpectomy x 2 and Rt SLNB with 2 negative nodes removed. Only the Rt side was cancer. Will do radiation bilaterally x 3 weeks.   PATIENT GOALS:  Reassess how my recovery is going related to arm function, pain, and swelling.  PAIN:  Are you having pain? No not just at rest, but near my Rt nipple with moving around  PRECAUTIONS: Recent Surgery, right UE Lymphedema risk  ACTIVITY LEVEL / LEISURE: back to most - not as much sweeping and mopping.    HAND DOMINANCE: right   WEIGHT BEARING RESTRICTIONS: No   FALLS:  Has patient fallen in last 6 months? 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  Rt side a bit larger globally. No fibrosis or peau de orange noted.  Slight redness lateral to nipple on the Rt.   PALPATION: seroma borders no sig fluid evident under Rt lumpectomy incision. No sig fibrosis noted.   POSTURE:  Rounded shoulders    LYMPHEDEMA ASSESSMENT:  UPPER EXTREMITY AROM/PROM:   A/PROM RIGHT   eval  02/18/23 and 03/24/23 05/22/23  Shoulder extension 50 50 50  Shoulder flexion 165 165 165  Shoulder abduction 165 165 165   Shoulder internal rotation      Shoulder external rotation 80 80 80                          (Blank rows = not tested)   A/PROM LEFT   Eval and 03/24/23 05/22/23  Shoulder extension 40 50  Shoulder flexion 155 160  Shoulder abduction 162 162  Shoulder internal rotation     Shoulder external rotation 80 80                          (Blank rows = not tested)   LYMPHEDEMA ASSESSMENTS: Undergoing SOZO screens currently.    TODAY"S TREATMENT: Pt permission and consent throughout each step of examination and treatment with modification and draping if requested when working on sensitive areas  05/22/23 Redid SOZO Reassessed status: ROM equal bilaterally  In seated: reviewed self MLD steps with pt doing this very well except needing vcs for slowing movements and switching from a large sliding motion to more small stretches  but otherwise her knowledge of steps was great.    04/09/23 In supine: Short neck, superficial and deep abdominals, 5 diaphragmatic breaths, bil axillary nodes and establishment of interaxillary pathway, R inguinal nodes and establishment of axilloinguinal pathway, then as close to Rt breast avoiding the radiation field at this time moving fluid towards pathways.   03/31/23 In supine: Short neck, 5 diaphragmatic breaths, bil axillary nodes and establishment of interaxillary pathway, R inguinal nodes and establishment of axilloinguinal pathway, then R breast moving fluid towards pathways spending extra time in any areas of fibrosis then retracing all steps. Education on lymphatic anatomy and physiology and steps of massage throughout and reviewed steps as needed.   03/24/23 Eval performed In supine: Short neck, 5 diaphragmatic breaths, bil axillary nodes and establishment of interaxillary pathway, R inguinal nodes and establishment of axilloinguinal pathway, then R breast moving fluid towards pathways spending extra time in any areas of fibrosis then retracing all steps. Education  on lymphatic anatomy and physiology and steps of massage throughout.  Gave self MLD handout   PATIENT EDUCATION:  Education details: self MLD Person educated: Patient Education method: Programmer, multimedia, Manufacturing engineer, vcs. tcs Education comprehension: verbalized understanding  HOME EXERCISE PROGRAM: Reviewed previously given post op HEP Self MLD Rt breast  ASSESSMENT:  CLINICAL IMPRESSION: Pt is doing well. She is recovering from her bilateral breast radiation nicely.  She had bad skin breakdown but feels better now.  She is doing her MLD well,  no fibrosis noted, SOZO screen was WNL and she will return for SOZO and as needed at this point.   Pt will benefit from skilled therapeutic intervention to improve on the following deficits: Decreased knowledge of precautions, impaired UE functional use, pain, decreased ROM, postural dysfunction.   PT treatment/interventions: ADL/Self care home management, Therapeutic exercises, Patient/Family education, Self Care, and Re-evaluation, Manual therapy   GOALS: Goals reviewed with patient? Yes  LONG TERM GOALS:  (STG=LTG)  GOALS Name Target Date  Goal status  1 Pt will demonstrate she has regained full shoulder ROM and function post operatively compared to baselines.  Baseline: 02/18/23 MET  2 Pt will be ind with self MLD 04/28/23 MET               PLAN:  PT FREQUENCY/DURATION: 1x per week x 4 weeks   PLAN FOR NEXT SESSION: Rt breast MLD with focus on self education,    Idamae Lusher, PT 05/22/2023, 10:45 AM  Manual Lymph Drainage for Right Breast.   Do daily.  Do slowly. Use flat hands with just enough pressure to stretch the skin. Do not slide over the skin, stretch the skin with the hand. (Stretch  Relax  Move) Lie down or sit comfortably (in a recliner, for example) to do this.   Do circles at each collar bone near the neck 5-7 times (to "wake up" lots of lymph nodes in this area).  Take slow deep breaths, allowing your belly to  balloon out as you breathe in, 5x (to "wake up" abdominal lymph nodes).  Do circles in both armpits--stretch skin in small circles to stimulate intact lymph nodes there, 5-7x.  Do Circles in the right groin area, at panty line--stretch skin to stimulate lymph nodes 5-7x.  Redirect fluid from right chest toward left armpit (stretch skin starting at right chest in 3-4 spots working toward left armpit) 3-4x across the chest.  Redirect fluid from right armpit toward right groin (cup your hand around the curve of your right side and do 3-4 "pumps" from armpit to groin) 3-4x down your side.  Draw an imaginary diagonal line from upper outer breast through the nipple area toward lower inner breast.  Direct fluid above this line upward and inward toward the pathway across your chest (established in #5).  Then direct the fluid below this line down and out toward the pathway down your side going towards the left groin (established in #6). Do this for a few minutes or until you feel any improvements.   Then repeat your pathways 2-3x  (#5 and #6)  End with repeating #3 and #4 above  (circles in both armpits and the left groin) PHYSICAL THERAPY DISCHARGE SUMMARY  Visits from Start of Care: 4  Current functional level related to goals / functional outcomes: See above   Remaining deficits: Lymphedema risk    Education / Equipment: Self MLD, HEP  Plan: Patient agrees to discharge.  Patient is being discharged due to meeting the stated rehab goals.

## 2023-05-25 ENCOUNTER — Ambulatory Visit: Payer: MEDICARE

## 2023-07-03 ENCOUNTER — Other Ambulatory Visit: Payer: Self-pay | Admitting: General Surgery

## 2023-07-03 DIAGNOSIS — K851 Biliary acute pancreatitis without necrosis or infection: Secondary | ICD-10-CM | POA: Insufficient documentation

## 2023-07-05 ENCOUNTER — Other Ambulatory Visit: Payer: Self-pay | Admitting: Hematology

## 2023-07-05 MED ORDER — ANASTROZOLE 1 MG PO TABS: 1.0000 mg | ORAL_TABLET | Freq: Every day | ORAL | 5 refills | Status: DC

## 2023-08-24 ENCOUNTER — Ambulatory Visit: Payer: MEDICARE | Attending: General Surgery | Admitting: Physical Therapy

## 2023-08-24 DIAGNOSIS — Z483 Aftercare following surgery for neoplasm: Secondary | ICD-10-CM | POA: Insufficient documentation

## 2023-08-24 NOTE — Therapy (Signed)
OUTPATIENT PHYSICAL THERAPY SOZO SCREENING NOTE   Patient Name: Theresa Duncan MRN: [id number] DOB:1956-02-23, 67 y.o., female Today's Date: 08/24/2023  PCP: Bernadette Hoit, MD REFERRING PROVIDER: [name], MD   PT End of Session - 08/24/23 1554     Visit Number 3    PT Start Time 1425    PT Stop Time 1435    PT Time Calculation (min) 10 min    Activity Tolerance Patient tolerated treatment well    Behavior During Therapy WFL for tasks assessed/performed             Past Medical History:  Diagnosis Date   Anxiety    chronic, panic attacks   Atrial fibrillation (HCC) 06/02/2010   treated with Cardiazem and Lovenox at [facility], now ASA   Cancer [facility])    breast   Colon polyp    tubular adenoma   Hypertension    Hypothyroidism    Migraine    Tachycardia 05/2010   Thyroid disease 05/2010   hypothyroid   Past Surgical History:  Procedure Laterality Date   APPENDECTOMY  1967   BREAST BIOPSY     BREAST BIOPSY Right 11/28/2022   MM RT BREAST BX W LOC DEV 1ST LESION IMAGE BX SPEC STEREO GUIDE 11/28/2022 [facility]   BREAST BIOPSY  01/20/2023   MM RT RADIOACTIVE SEED LOC MAMMO GUIDE 01/20/2023 [facility]   BREAST BIOPSY  01/20/2023   MM RT RADIOACTIVE SEED EA ADD LESION LOC MAMMO GUIDE 01/20/2023 [facility]   BREAST BIOPSY  01/20/2023   MM LT RADIOACTIVE SEED LOC MAMMO GUIDE 01/20/2023 [facility]   BREAST BIOPSY  01/20/2023   MM LT RADIOACTIVE SEED EA ADD LESION LOC MAMMO GUIDE 01/20/2023 [facility]   BREAST LUMPECTOMY Left 10/2017   BREAST LUMPECTOMY WITH RADIOACTIVE SEED AND SENTINEL LYMPH NODE BIOPSY Right 01/22/2023   Procedure: RIGHT BREAST LUMPECTOMY WITH RADIOACTIVE SEED X2 AND SENTINEL LYMPH NODE BIOPSY;  Surgeon: [name], MD;  Location: [facility] OR;  Service: General;  Laterality: Right;   BREAST LUMPECTOMY WITH RADIOACTIVE SEED LOCALIZATION Left 11/18/2017   Procedure: LEFT BREAST LUMPECTOMY WITH RADIOACTIVE SEED  LOCALIZATION;  Surgeon: [name], MD;  Location:  SURGERY CENTER;  Service: General;  Laterality: Left;   BREAST LUMPECTOMY WITH RADIOACTIVE SEED LOCALIZATION Left 01/22/2023   Procedure: LEFT BREAST LUMPECTOMY WITH RADIOACTIVE SEED LOCALIZATION X2;  Surgeon: [name], MD;  Location: [facility] OR;  Service: General;  Laterality: Left;   HERNIA REPAIR N/A    WISDOM TOOTH EXTRACTION     Patient Active Problem List   Diagnosis Date Noted   Malignant neoplasm of lower-inner quadrant of right breast of female, estrogen receptor positive (HCC) 12/12/2022   Lobular carcinoma in situ (LCIS) of left breast 12/18/2017   Situational anxiety 10/15/2017   Lobular carcinoma of left breast (HCC) 10/13/2017   Palpitations 10/01/2016   Hair loss 07/15/2016   Seborrheic keratoses 07/15/2016   Hyperlipidemia 12/20/2014   Atrial fibrillation (HCC) 05/16/2014   Essential hypertension 05/16/2014   PVCs (premature ventricular contractions) 05/16/2014   Preventative health care 05/16/2014   Chronic anxiety 07/05/2013   Other specified hypothyroidism 07/05/2013   Morbid obesity (HCC) 07/05/2013   Vitamin D deficiency 07/05/2013    REFERRING DIAG: left breast cancer at risk for lymphedema  THERAPY DIAG:  Aftercare following surgery for neoplasm  PERTINENT HISTORY: Hx of Lt LCIS in 2018 with lumpectomy and no nodes removed. Now 2 spots on the Rt and 2 spots on  the left. Underwent Lt lumpectomy x 2 Rt lumpectomy x 2 and Rt SLNB with 2 negative nodes removed. Only the Rt side was cancer. Will do radiation bilaterally x 3 weeks.   PRECAUTIONS: right UE Lymphedema risk  SUBJECTIVE: Here for SOZO screen  PAIN:  Are you having pain? No  SOZO SCREENING: Patient was assessed today using the SOZO machine to determine the lymphedema index score. This was compared to her baseline score. It was determined that she is within the recommended range when compared to her baseline and no further action is  needed at this time. She will continue SOZO screenings. These are done every 3 months for 2 years post operatively followed by every 6 months for 2 years, and then annually.   L-DEX FLOWSHEETS - 08/24/23 1500       L-DEX LYMPHEDEMA SCREENING   Measurement Type Unilateral    L-DEX MEASUREMENT EXTREMITY Upper Extremity    POSITION  Standing    DOMINANT SIDE Right    At Risk Side Right    BASELINE SCORE (UNILATERAL) -4.2    L-DEX SCORE (UNILATERAL) -6    VALUE CHANGE (UNILAT) -1.8             Bethann Punches, Fellows 08/24/23 3:57 PM

## 2023-08-25 ENCOUNTER — Encounter: Payer: Self-pay | Admitting: Nurse Practitioner

## 2023-08-25 ENCOUNTER — Inpatient Hospital Stay: Payer: MEDICARE | Attending: Nurse Practitioner

## 2023-08-25 ENCOUNTER — Inpatient Hospital Stay (HOSPITAL_BASED_OUTPATIENT_CLINIC_OR_DEPARTMENT_OTHER): Payer: MEDICARE | Admitting: Nurse Practitioner

## 2023-08-25 VITALS — BP 136/74 | HR 64 | Temp 97.8°F | Resp 17 | Wt 210.3 lb

## 2023-08-25 DIAGNOSIS — L309 Dermatitis, unspecified: Secondary | ICD-10-CM | POA: Insufficient documentation

## 2023-08-25 DIAGNOSIS — C50311 Malignant neoplasm of lower-inner quadrant of right female breast: Secondary | ICD-10-CM | POA: Diagnosis not present

## 2023-08-25 DIAGNOSIS — Z1732 Human epidermal growth factor receptor 2 negative status: Secondary | ICD-10-CM | POA: Diagnosis not present

## 2023-08-25 DIAGNOSIS — Z170 Estrogen receptor positive status [ER+]: Secondary | ICD-10-CM | POA: Diagnosis not present

## 2023-08-25 DIAGNOSIS — I890 Lymphedema, not elsewhere classified: Secondary | ICD-10-CM | POA: Diagnosis not present

## 2023-08-25 DIAGNOSIS — Z923 Personal history of irradiation: Secondary | ICD-10-CM | POA: Diagnosis not present

## 2023-08-25 DIAGNOSIS — Z1721 Progesterone receptor positive status: Secondary | ICD-10-CM | POA: Diagnosis not present

## 2023-08-25 DIAGNOSIS — D0502 Lobular carcinoma in situ of left breast: Secondary | ICD-10-CM

## 2023-08-25 DIAGNOSIS — Z79811 Long term (current) use of aromatase inhibitors: Secondary | ICD-10-CM | POA: Insufficient documentation

## 2023-08-25 LAB — CMP (CANCER CENTER ONLY)
ALT: 15 U/L (ref 0–44)
AST: 15 U/L (ref 15–41)
Albumin: 4.3 g/dL (ref 3.5–5.0)
Alkaline Phosphatase: 63 U/L (ref 38–126)
Anion gap: 8 (ref 5–15)
BUN: 15 mg/dL (ref 8–23)
CO2: 26 mmol/L (ref 22–32)
Calcium: 9.6 mg/dL (ref 8.9–10.3)
Chloride: 106 mmol/L (ref 98–111)
Creatinine: 0.72 mg/dL (ref 0.44–1.00)
GFR, Estimated: >60 mL/min (ref >60)
Glucose, Bld: 122 mg/dL — ABNORMAL HIGH (ref 70–99)
Potassium: 4.4 mmol/L (ref 3.5–5.1)
Sodium: 140 mmol/L (ref 135–145)
Total Bilirubin: 0.6 mg/dL (ref <1.2)
Total Protein: 7.7 g/dL (ref 6.5–8.1)

## 2023-08-25 LAB — CBC WITH DIFFERENTIAL (CANCER CENTER ONLY)
Abs Immature Granulocytes: 0.02 10*3/uL (ref 0.00–0.07)
Basophils Absolute: 0 10*3/uL (ref 0.0–0.1)
Basophils Relative: 1 %
Eosinophils Absolute: 0.1 10*3/uL (ref 0.0–0.5)
Eosinophils Relative: 1 %
HCT: 41 % (ref 36.0–46.0)
Hemoglobin: 13.7 g/dL (ref 12.0–15.0)
Immature Granulocytes: 0 %
Lymphocytes Relative: 10 %
Lymphs Abs: 0.8 10*3/uL (ref 0.7–4.0)
MCH: 31.4 pg (ref 26.0–34.0)
MCHC: 33.4 g/dL (ref 30.0–36.0)
MCV: 93.8 fL (ref 80.0–100.0)
Monocytes Absolute: 0.6 10*3/uL (ref 0.1–1.0)
Monocytes Relative: 8 %
Neutro Abs: 6.1 10*3/uL (ref 1.7–7.7)
Neutrophils Relative %: 80 %
Platelet Count: 221 10*3/uL (ref 150–400)
RBC: 4.37 MIL/uL (ref 3.87–5.11)
RDW: 13.1 % (ref 11.5–15.5)
WBC Count: 7.7 10*3/uL (ref 4.0–10.5)
nRBC: 0 % (ref 0.0–0.2)

## 2023-08-25 NOTE — Progress Notes (Signed)
Patient Care Team: Bernadette Hoit, MD as PCP - General (Family Medicine) [name], MD as Consulting Physician (General Surgery) Malachy Mood, MD as Consulting Physician (Hematology)  Clinic Day:  08/25/2023  Referring physician: Bernadette Hoit, MD  ASSESSMENT & PLAN:   Assessment & Plan: Malignant neoplasm of lower-inner quadrant of right breast of female, estrogen receptor positive (HCC)  ILC and LCIS, G2, stage IA (pT1aN0), ER/PR + HER2 - Ki67 5%, cT1a;  -Screening detected R breast distortion and asymmetry spanning 1.6 cm in the lower inner quadrant, biopsy 11/28/22 revealed invasive lobular carcinoma and intermediate grade LCIS.  Lymphovascular invasion was not identified.  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 Any adjuvant therapy is preventative/to reduce recurrence risk -She completed b/l breast radiation 04/15/23 and is recovering.  -Given her extensive history and that the component of invasive lobular carcinoma on biopsy was strongly ER and PR positive, we do recommend adjuvant aromatase inhibitor to reduce her risk of cancer recurrence,  The potential benefit and side effects, which includes but not limited to, hot flash, skin and vaginal dryness, metabolic changes ( increased blood glucose, cholesterol, weight, etc.), slightly in increased risk of cardiovascular disease, cataracts, muscular and joint discomfort, osteopenia and osteoporosis, etc, were  discussed with her in great details. She is interested,  -Due to her concern for thrombosis in the setting of Afib, will reserve Tamoxifen as last resort. I recommend Anastrozole. She agrees to try -Will start in 3-4 weeks, after she recovers from RT.  -08/25/2023 - patient had not yet started on AI. Will start this upcoming weekend.  -F/up in months for surveillance and AI toxicity check -Mammogram due in January 2025  Lobular carcinoma in situ (LCIS) of left breast -Diagnosed in 09/2017. S/p left breast lumpectomy. She had complete resection and no invasive cancer.  -She declined chemoprevention with anti-estrogen therapy. -She has been on surveillance with annual mammograms. Additional screening breast MRI was recommended, but she declined due to claustrophobia.  -Pre-op MRI showed additional masses, biopsy 12/24/22 showed multifocal (x2) DCIS with calcifications and necrosis -S/p bilateral lumpectomy 01/22/23, left breast path showed focal ADH, ALH and LCIS, no invasive carcinoma  -She completed bilateral radiation at [facility] 04/15/23, she tolerated with expected dermatitis which she is managing -See #1 for anti-estrogen      Plan: Labs reviewed  -CBC showing WBC 7.7; Hgb 13.7; Hct 41.0; Plt 221; Anc 6.4 -CMP - K 4.4; glucose 122; BUN 15; Creatinine 0.72; eGFR >60; Ca 9.6; LFTs normal.   Patient states that she has not started taking anastrozole as of yet. Was hesitant to start when she was getting COVID and flu vaccines. She finally got her RSV vaccine yesterday. She has had no problems with any of the vaccines.  She plans to start taking anastrozole over the weekend.  Will see back in 3 months for labs and follow up/toxicity check.  She is scheduled for DEXA scan 12/25/2023  The patient understands the plans discussed today and is in  agreement with them.  She knows to contact our office if she develops concerns prior to her next appointment.  I provided 25 minutes of face-to-face time  during this encounter and > 50% was spent counseling as documented under my assessment and plan.    Carlean Jews, NP  [facility] - A DEPT OF [facility] [address] [place] [address] Dept: [phone] Dept Fax: [phone]   No orders of the defined types were placed in this encounter.     CHIEF COMPLAINT:  CC: right breast cancer, ER + and history of left breast LCIS  Current Treatment:  anastrozole 1 mg daily started 04/2023  INTERVAL HISTORY:  Theresa Duncan is here today for repeat clinical assessment. Last saw Clayborn Heron, NP and Dr. Mosetta Putt on 04/22/2023. Recommended anti-estrogen therapy in from of aromatase inhibitor such as anastrozole daily. She did have strongly positive estrogen receptor breast cancer, and use of AI would help prevent further episodes of breast cancer. She was concerned about risk of thrombus formation, as she does have intermittent a-fib. She is on flecainide, but does not appear to be on anticoagulant. Has not started AI yet. Plans to start this weekend. Has got all recommended vaccines: COVID, flu, and RSV. Continues to have mild lymphedema in right arm and upper chest. Gradually improving. She denies chest pain, chest pressure, or shortness of breath. She denies headaches or visual disturbances. She denies abdominal pain, nausea, vomiting, or changes in bowel or bladder habits.   Her appetite is good. Her weight has been stable.  I have reviewed the past medical history, past surgical history, social history and family history with the patient and they are unchanged from previous note.  ALLERGIES:  is allergic to penicillins, adhesive [tape], and enoxaparin.  MEDICATIONS:  Current Outpatient Medications  Medication Sig Dispense Refill   acetaminophen (TYLENOL) 500 MG tablet Take 1,000 mg by mouth every 6 (six) hours as needed for moderate pain or mild pain.     ALPRAZolam (XANAX) 0.25 MG tablet  Take 0.25 mg by mouth daily as needed for anxiety.     anastrozole (ARIMIDEX) 1 MG tablet Take 1 tablet (1 mg total) by mouth daily. 30 tablet 5   aspirin EC 81 MG tablet Take 81 mg by mouth daily.     Cholecalciferol (VITAMIN D3) 2000 UNITS TABS Take 2,000 Units by mouth daily.     diltiazem (CARDIZEM CD) 180 MG 24 hr capsule Take 180 mg by mouth daily.     flecainide (TAMBOCOR) 100 MG tablet Take 50-100 mg by mouth See admin instructions. Take 100 mg in the am and 50 mg at bedtime     GLUCOSAMINE SULFATE PO Take 1,500 mg by mouth daily.     ibuprofen (ADVIL,MOTRIN) 200 MG tablet Take 200 mg by mouth every 6 (six) hours as needed for pain.     levothyroxine (SYNTHROID) 112 MCG tablet Take 112 mcg by mouth daily before breakfast.     losartan (COZAAR) 25 MG tablet Take 25 mg by mouth daily.     meclizine (ANTIVERT) 25 MG tablet Take 25 mg by mouth 3 (three) times daily as needed for dizziness.     Multiple Vitamin (MULTIVITAMIN) tablet Take 1 tablet by mouth daily.     rosuvastatin (CRESTOR) 5 MG tablet Take 5 mg by mouth daily.     trolamine salicylate (ASPERCREME) 10 % cream Apply 1 Application topically daily as needed (lower back  pain).     valACYclovir (VALTREX) 500 MG tablet Take 500 mg by mouth daily as needed (Flare-up).     No current facility-administered medications for this visit.    HISTORY OF PRESENT ILLNESS:   Oncology History Overview Note  Cancer Staging No matching staging information was found for the patient.      Lobular carcinoma in situ (LCIS) of left breast  09/15/2017 Mammogram   Mammogram 09/15/17 IMPRESSION: Suspicious distortion at 12 o'clock in the left breast.   RECOMMENDATION: Recommend stereotactic biopsy of the left breast distortion    10/02/2017 Initial Biopsy   Diagnosis 10/02/17 Breast, left, needle core biopsy, UOQ at anterior depth - MAMMARY CARCINOMA IN-SITU ARISING IN A COMPLEX SCLEROSING LESION - ATYPICAL LOBULAR HYPERPLASIA -  CALCIFICATIONS - SEE COMMENT   11/18/2017 Surgery   LEFT BREAST LUMPECTOMY WITH RADIOACTIVE SEED LOCALIZATION by Dr Donell Beers 11/18/17    11/18/2017 Pathology Results   Diagnosis 11/18/17 Breast, lumpectomy, Left - LOBULAR CARCINOMA IN-SITU ARISING IN A COMPLEX SCLEROSING LESION - INTRADUCTAL PAPILLOMA - PREVIOUS BIOPSY SITE CHANGES - CALCIFICATIONS - SEE COMMENT   12/18/2017 Initial Diagnosis   Lobular carcinoma in situ (LCIS) of left breast    Anti-estrogen oral therapy   She declined antiestrogen therapy.    10/31/2022 Mammogram   Diagnostic mammogram right breast with ultrasound IMPRESSION: 1. Persistent focal asymmetry with associated subtle architectural distortion and scattered calcifications involving the LOWER INNER QUADRANT of the RIGHT breast without sonographic correlate. 2. Possible second focus of architectural distortion in the central RIGHT breast (retroareolar location at middle depth) identified on the full field mediolateral view, again without sonographic correlate. There is no correlate on the screening CC or MLO views. 3. No pathologic RIGHT axillary lymphadenopathy.  RECOMMENDATION: Stereotactic tomosynthesis core needle biopsy of the asymmetry/distortion associated with calcifications in the LOWER INNER QUADRANT of the RIGHT breast and the second focus of architectural distortion in the central RIGHT breast (if this persists at the time of biopsy).   12/12/2022 Imaging   MRI bilateral breast IMPRESSION: 1. Biopsy-proven malignancy within the LOWER RIGHT breast, with entire area of non masslike enhancement and nodular components measuring 3 x 2.5 x 2.5 cm. 2. Indeterminate 4 cm area of non masslike enhancement within the anterior to middle depth UPPER RIGHT breast. Tissue sampling recommended. 3. Indeterminate 2.5 cm anterior LOWER INNER LEFT breast non masslike enhancement. Tissue sampling recommended. 4. Indeterminate 3.5 cm non masslike enhancement with some  nodular components in the UPPER LEFT breast. Tissue sampling recommended.   RECOMMENDATION: MR guided biopsies of indeterminate UPPER RIGHT breast non masslike enhancement, indeterminate LOWER LEFT breast non masslike enhancement and indeterminate UPPER LEFT breast non masslike enhancement.   BI-RADS CATEGORY  4: Suspicious       REVIEW OF SYSTEMS:   Constitutional: Denies fevers, chills or abnormal weight loss.  Eyes: Denies blurriness of vision Ears, nose, mouth, throat, and face: Denies mucositis or sore throat Respiratory: Denies cough, dyspnea or wheezes Cardiovascular: Denies palpitation, chest discomfort or lower extremity swelling Gastrointestinal:  Denies nausea, heartburn or change in bowel habits Skin: Denies abnormal skin rashes Lymphatics: Denies new lymphadenopathy or easy bruising Neurological:Denies numbness, tingling or new weaknesses Behavioral/Psych: Mood is stable, no new changes  All other systems were reviewed with the patient and are negative.   VITALS:   Today's Vitals   08/25/23 1119  BP: 136/74  Pulse: 64  Resp: 17  Temp: 97.8 F (36.6 C)  TempSrc: Temporal  SpO2: 98%  Weight: 210  lb 4.8 oz (95.4 kg)   Body mass index is 41.07 kg/m.   Wt Readings from Last 3 Encounters:  08/25/23 210 lb 4.8 oz (95.4 kg)  04/22/23 210 lb 9.6 oz (95.5 kg)  01/22/23 207 lb (93.9 kg)    Body mass index is 41.07 kg/m.  Performance status (ECOG): 1 - Symptomatic but completely ambulatory  PHYSICAL EXAM:   GENERAL:alert, no distress and comfortable SKIN: skin color, texture, turgor are normal, no rashes or significant lesions EYES: normal, Conjunctiva are pink and non-injected, sclera clear OROPHARYNX:no exudate, no erythema and lips, buccal mucosa, and tongue normal  NECK: supple, thyroid normal size, non-tender, without nodularity LYMPH:  no palpable lymphadenopathy in the cervical, axillary or inguinal LUNGS: clear to auscultation and percussion with  normal breathing effort HEART: regular rate & rhythm and no murmurs and no lower extremity edema ABDOMEN:abdomen soft, non-tender and normal bowel sounds Musculoskeletal:no cyanosis of digits and no clubbing  NEURO: alert & oriented x 3 with fluent speech, no focal motor/sensory deficits BREAST EXAM: No nipple discharge or inversion.  S/p left lumpectomy. Firm scar tissue beneath lumpectomy scar.  Right breast lumpectomy scars have healed well, including right axillary surgical scar. Bilateral breasts fibrous in texture without palpable masses or lumps.  There is no axillary lymphadenopathy bilaterally   LABORATORY DATA:  I have reviewed the data as listed    Component Value Date/Time   NA 140 08/25/2023 1044   NA 141 01/12/2012 0000   K 4.4 08/25/2023 1044   CL 106 08/25/2023 1044   CO2 26 08/25/2023 1044   GLUCOSE 122 (H) 08/25/2023 1044   BUN 15 08/25/2023 1044   BUN 12 01/12/2012 0000   CREATININE 0.72 08/25/2023 1044   CALCIUM 9.6 08/25/2023 1044   PROT 7.7 08/25/2023 1044   ALBUMIN 4.3 08/25/2023 1044   AST 15 08/25/2023 1044   ALT 15 08/25/2023 1044   ALKPHOS 63 08/25/2023 1044   BILITOT 0.6 08/25/2023 1044   GFRNONAA >60 08/25/2023 1044     Lab Results  Component Value Date   WBC 7.7 08/25/2023   NEUTROABS 6.1 08/25/2023   HGB 13.7 08/25/2023   HCT 41.0 08/25/2023   MCV 93.8 08/25/2023   PLT 221 08/25/2023

## 2023-08-25 NOTE — Assessment & Plan Note (Addendum)
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  Lymphovascular invasion was not identified.  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 Any adjuvant therapy is preventative/to reduce recurrence risk -She completed b/l breast radiation 04/15/23 and is recovering.  -Given her extensive history and that the component of invasive lobular carcinoma on biopsy was strongly ER and PR positive, we do recommend adjuvant aromatase inhibitor to reduce her risk of cancer recurrence,  The potential benefit and side effects, which includes but not limited to, hot flash, skin and vaginal dryness, metabolic changes ( increased blood glucose, cholesterol, weight, etc.), slightly in increased risk of cardiovascular disease, cataracts, muscular and joint discomfort, osteopenia and osteoporosis, etc, were discussed with her in great details. She is interested,  -Due to her concern for thrombosis in the setting of Afib, will reserve Tamoxifen as last resort. I recommend Anastrozole. She agrees to try -Will start in 3-4 weeks, after she recovers from RT.  -08/25/2023 - patient had not yet started on AI. Will start this upcoming weekend.  -F/up in months for surveillance and AI toxicity check -Mammogram due in  January 2025

## 2023-08-25 NOTE — Assessment & Plan Note (Signed)
-  Diagnosed in 09/2017. S/p left breast lumpectomy. She had complete resection and no invasive cancer.  -She declined chemoprevention with anti-estrogen therapy. -She has been on surveillance with annual mammograms. Additional screening breast MRI was recommended, but she declined due to claustrophobia.  -Pre-op MRI showed additional masses, biopsy 12/24/22 showed multifocal (x2) DCIS with calcifications and necrosis -S/p bilateral lumpectomy 01/22/23, left breast path showed focal ADH, ALH and LCIS, no invasive carcinoma  -She completed bilateral radiation at [facility] 04/15/23, she tolerated with expected dermatitis which she is managing -See #1 for anti-estrogen

## 2023-09-21 DIAGNOSIS — M7061 Trochanteric bursitis, right hip: Secondary | ICD-10-CM | POA: Insufficient documentation

## 2023-09-28 ENCOUNTER — Other Ambulatory Visit: Payer: Self-pay | Admitting: Hematology

## 2023-10-27 ENCOUNTER — Ambulatory Visit
Admission: RE | Admit: 2023-10-27 | Discharge: 2023-10-27 | Disposition: A | Discharge status: Home / Self Care | Payer: MEDICARE | Source: Ambulatory Visit | Attending: Nurse Practitioner | Admitting: Nurse Practitioner

## 2023-10-27 DIAGNOSIS — C50311 Malignant neoplasm of lower-inner quadrant of right female breast: Secondary | ICD-10-CM

## 2023-10-27 DIAGNOSIS — D0502 Lobular carcinoma in situ of left breast: Secondary | ICD-10-CM

## 2023-10-27 HISTORY — DX: Personal history of irradiation: Z92.3

## 2023-10-28 ENCOUNTER — Encounter: Payer: Self-pay | Admitting: Nurse Practitioner

## 2023-11-16 ENCOUNTER — Other Ambulatory Visit (HOSPITAL_COMMUNITY)
Admission: RE | Admit: 2023-11-16 | Discharge: 2023-11-16 | Disposition: A | Discharge status: Home / Self Care | Payer: MEDICARE | Source: Ambulatory Visit | Attending: Nurse Practitioner | Admitting: Nurse Practitioner

## 2023-11-16 ENCOUNTER — Encounter: Payer: Self-pay | Admitting: Nurse Practitioner

## 2023-11-16 ENCOUNTER — Ambulatory Visit: Payer: MEDICARE | Admitting: Family Medicine

## 2023-11-16 ENCOUNTER — Encounter: Payer: Self-pay | Admitting: Family Medicine

## 2023-11-16 ENCOUNTER — Ambulatory Visit (INDEPENDENT_AMBULATORY_CARE_PROVIDER_SITE_OTHER): Payer: MEDICARE | Admitting: Nurse Practitioner

## 2023-11-16 VITALS — BP 122/78 | HR 55 | Temp 98.5°F | Ht 59.75 in | Wt 209.5 lb

## 2023-11-16 VITALS — BP 144/86 | HR 76 | Resp 16 | Ht 59.75 in | Wt 209.0 lb

## 2023-11-16 DIAGNOSIS — E785 Hyperlipidemia, unspecified: Secondary | ICD-10-CM | POA: Diagnosis not present

## 2023-11-16 DIAGNOSIS — Z9189 Other specified personal risk factors, not elsewhere classified: Secondary | ICD-10-CM | POA: Diagnosis not present

## 2023-11-16 DIAGNOSIS — Z780 Asymptomatic menopausal state: Secondary | ICD-10-CM

## 2023-11-16 DIAGNOSIS — Z124 Encounter for screening for malignant neoplasm of cervix: Secondary | ICD-10-CM | POA: Diagnosis present

## 2023-11-16 DIAGNOSIS — I4891 Unspecified atrial fibrillation: Secondary | ICD-10-CM

## 2023-11-16 DIAGNOSIS — R002 Palpitations: Secondary | ICD-10-CM

## 2023-11-16 DIAGNOSIS — I10 Essential (primary) hypertension: Secondary | ICD-10-CM | POA: Diagnosis not present

## 2023-11-16 DIAGNOSIS — F418 Other specified anxiety disorders: Secondary | ICD-10-CM

## 2023-11-16 DIAGNOSIS — Z170 Estrogen receptor positive status [ER+]: Secondary | ICD-10-CM

## 2023-11-16 DIAGNOSIS — Z01419 Encounter for gynecological examination (general) (routine) without abnormal findings: Secondary | ICD-10-CM

## 2023-11-16 DIAGNOSIS — E038 Other specified hypothyroidism: Secondary | ICD-10-CM

## 2023-11-16 DIAGNOSIS — C50311 Malignant neoplasm of lower-inner quadrant of right female breast: Secondary | ICD-10-CM

## 2023-11-16 DIAGNOSIS — D0502 Lobular carcinoma in situ of left breast: Secondary | ICD-10-CM

## 2023-11-16 MED ORDER — ALPRAZOLAM 0.25 MG PO TABS: 0.2500 mg | ORAL_TABLET | Freq: Every day | ORAL | 1 refills | Status: DC | PRN

## 2023-11-16 MED ORDER — ROSUVASTATIN CALCIUM 5 MG PO TABS: 5.0000 mg | ORAL_TABLET | Freq: Every day | ORAL | 1 refills | Status: DC

## 2023-11-16 NOTE — Progress Notes (Signed)
 Theresa Duncan 1956-06-11 [id number]   History:  68 y.o. G1P0010 presents for breast and pelvic exam. Postmenopausal - no HRT, no bleeding. Normal pap history. 2019 left breast cancer (LCIS) managed with lumpectomy, declined antiestrogen therapy. March 2024 right ER/PR + HER2 neg, left DCIS - bilateral lumpectomies, radiation. Started Anastrozole 09/2023, tolerating well. History of hypothyroidism, HTN, a fib.   Gynecologic History Patient's last menstrual period was 02/27/2010.   Contraception: post menopausal status Sexually active: No  Health Maintenance Last Pap: 02/16/2018. Results were: Normal neg HPV Last mammogram: 10/27/2023. Results were: Normal, surgical changes Last colonoscopy: 04/2023. Results were: Normal per patient Last Dexa: Never. Scheduled 3/28  Past medical history, past surgical history, family history and social history were all reviewed and documented in the EPIC chart. Married. Retired. Stepdaughter, 3 granddaughters. Husband had stroke July 2022, she has been main caregiver. He is doing much better.   ROS:  A ROS was performed and pertinent positives and negatives are included.  Exam:  Vitals:   11/16/23 0923  BP: (!) 144/86  Pulse: 76  Resp: 16  Weight: 209 lb (94.8 kg)  Height: 4' 11.75" (1.518 m)    Body mass index is 41.16 kg/m.  General appearance:  Normal Thyroid:  Symmetrical, normal in size, without palpable masses or nodularity. Respiratory  Auscultation:  Clear without wheezing or rhonchi Cardiovascular  Auscultation:  Regular rate, without rubs, murmurs or gallops  Edema/varicosities:  Not grossly evident Abdominal  Soft,nontender, without masses, guarding or rebound.  Liver/spleen:  No organomegaly noted  Hernia:  None appreciated  Skin  Inspection:  Grossly normal Breasts: Examined lying and sitting.   Right: Without masses, retractions, nipple discharge or axillary adenopathy. Mild lymphedema present.  Left: Without masses,  retractions, nipple discharge or axillary adenopathy. Pelvic: External genitalia:  no lesions              Urethra:  normal appearing urethra with no masses, tenderness or lesions              Bartholins and Skenes: normal                 Vagina: normal appearing vagina with normal color and discharge, no lesions. Atrophic changes              Cervix: no lesions Bimanual Exam:  Uterus:  no masses or tenderness              Adnexa: no mass, fullness, tenderness              Rectovaginal: Deferred              Anus:  normal, no lesions  Patient informed chaperone available to be present for breast and pelvic exam. Patient has requested no chaperone to be present. Patient has been advised what will be completed during breast and pelvic exam.   Assessment/Plan:  68 y.o. G1P0010 for breast and pelvic exam.   Encounter for breast and pelvic examination - Education provided on SBEs, importance of preventative screenings, current guidelines, high calcium diet, regular exercise, and multivitamin daily.  Labs with PCP.   Postmenopausal - no HRT, no bleeding  Malignant neoplasm of lower-inner quadrant of right breast of female, estrogen receptor positive [facility]) - March 2024, managed with lumpectomy, radiation, anti-estrogen therapy. Followed by oncology. Normal mammogram last month. Will have MRI in 6 months.  Lobular carcinoma in situ (LCIS) of left breast - March 2024. lumpectomy. Followed by oncology.   Cervical  cancer screening - Plan: Cytology - PAP( [name]).  Normal Pap history.  Option to stop screenings if normal.   Screening for colon cancer - 2024 colonoscopy. Will repeat at GI's recommended interval.   Screening for osteoporosis - DXA scheduled 3/28.  Return in about 1 year (around 11/15/2024) for B&P (high risk).    [name] DNP, 10:10 AM 11/16/2023

## 2023-11-16 NOTE — Progress Notes (Signed)
 Patient Office Visit  Assessment & Plan:  Essential hypertension  Other specified hypothyroidism  Hyperlipidemia, unspecified hyperlipidemia type -     Rosuvastatin Calcium; Take 1 tablet (5 mg total) by mouth daily.  Dispense: 90 tablet; Refill: 1  Atrial fibrillation, unspecified type (HCC)  Palpitations  Situational anxiety -     ALPRAZolam; Take 1 tablet (0.25 mg total) by mouth daily as needed for anxiety.  Dispense: 30 tablet; Refill: 1   Test results were reviewed and analyzed as part of the medical decision making of this visit.  Reviewed previous notes from cardiology, family medicine and oncology during the office visit.  Patient will continue current medications, continue healthy diet i.e. weight watchers and gradual exercise program and gradual weight loss.  Patient will return in May when her husband comes in.  Return in about 3 months (around 02/13/2024), or if symptoms worsen or fail to improve.   Subjective:    Patient ID: Theresa Duncan, female    DOB: [birth year]  Age: 68 y.o. MRN: [id number]  Chief Complaint  Patient presents with   Establish Care   Medical Management of Chronic Issues    HPI HTN-using antihypertensive medication without difficulty.  Denies associated signs and symptoms including chest pain, shortness of breath, cough headache, peripheral swelling cramps spasms and palpitations.  Voices understanding of the potential for interference with blood pressure control with substances including high sodium intake, decongestions, herbal supplements weight loss supplements nutritional supplements.  Blood pressures at home are less than 140/90.   Rejoined Weight Wat[employer] about 2 weeks ago. Hyperlipidemia-denies unusual muscle aches or muscle cramps or difficulty tolerating statin therapy.  Aware of need for diet control, exercise and healthy eating.  Patient is aware not to consume grapefruit juice or pomegranate juice.  And is doing okay with the Crestor.   Patient has joined [employer] a couple weeks ago and is trying to eat healthier overall. Left sided Breast CA- pt had mammogram January which was normal and will need Breast MRI next time.  Has started on Arimidex and is doing okay so far.  Patient will do a bone density scan next month. Stress/anxiety- pt's husband has improved since having stroke about 1.5 years ago. Pt still taking Ativan as needed.  Patient will have a prescription that will last several months.  Patient does not need to take it daily.  Husband's appointments have stretched out a bit so this is good for her.  Patient is able to get away a little bit during the week but not overnight. Atrial fibrillation-patient does see cardiology regarding this.  Patient takes flecainide and diltiazem CD1 80 mg once a day.  Patient does not want to take Eliquis for prevention of stroke.  Patient not having chest pain or shortness of breath.  Patient will have episodes on and off of A-fib and does not always know why.  Patient has realized a stress can contribute to this.  Was told by the cardiologist that she can take another half a tablet of the flecainide if she does have another episode. The 10-year ASCVD risk score (Arnett DK, et al., 2019) is: 8.5%  The patient's CHADS2-VASc score is 3, indicating a 3.2% annual risk of stroke.  CHF History: 0 HTN History: 1 Diabetes History: 0 Stroke History: 0 Vascular Disease History: 0 Age Score: 1 Gender Score: 1    Past Medical History:  Diagnosis Date   Anxiety    chronic, panic attacks   Atrial fibrillation (  HCC) 06/02/2010   treated with Cardiazem and Lovenox at [facility], now [facility]   Cancer [facility])    breast   Colon polyp    tubular adenoma   Hypertension    Hypothyroidism    Migraine    Personal history of radiation therapy    Tachycardia 05/2010   Thyroid disease 05/2010   hypothyroid   Past Surgical History:  Procedure Laterality Date   APPENDECTOMY  1967   BREAST BIOPSY      BREAST BIOPSY Right 11/28/2022   MM RT BREAST BX W LOC DEV 1ST LESION IMAGE BX SPEC STEREO GUIDE 11/28/2022 [facility]   BREAST BIOPSY  01/20/2023   MM RT RADIOACTIVE SEED LOC MAMMO GUIDE 01/20/2023 [facility]   BREAST BIOPSY  01/20/2023   MM RT RADIOACTIVE SEED EA ADD LESION LOC MAMMO GUIDE 01/20/2023 [facility]   BREAST BIOPSY  01/20/2023   MM LT RADIOACTIVE SEED LOC MAMMO GUIDE 01/20/2023 [facility]   BREAST BIOPSY  01/20/2023   MM LT RADIOACTIVE SEED EA ADD LESION LOC MAMMO GUIDE 01/20/2023 [facility]   BREAST LUMPECTOMY Left 10/2017   BREAST LUMPECTOMY Bilateral 01/22/2023   Right-ILC, Left-DCIS, ALH   BREAST LUMPECTOMY WITH RADIOACTIVE SEED AND SENTINEL LYMPH NODE BIOPSY Right 01/22/2023   Procedure: RIGHT BREAST LUMPECTOMY WITH RADIOACTIVE SEED X2 AND SENTINEL LYMPH NODE BIOPSY;  Surgeon: [name], MD;  Location: [facility] OR;  Service: General;  Laterality: Right;   BREAST LUMPECTOMY WITH RADIOACTIVE SEED LOCALIZATION Left 11/18/2017   Procedure: LEFT BREAST LUMPECTOMY WITH RADIOACTIVE SEED LOCALIZATION;  Surgeon: [name], MD;  Location: [facility];  Service: General;  Laterality: Left;   BREAST LUMPECTOMY WITH RADIOACTIVE SEED LOCALIZATION Left 01/22/2023   Procedure: LEFT BREAST LUMPECTOMY WITH RADIOACTIVE SEED LOCALIZATION X2;  Surgeon: [name], MD;  Location: [facility] OR;  Service: General;  Laterality: Left;   HERNIA REPAIR N/A    WISDOM TOOTH EXTRACTION     Social History   Tobacco Use   Smoking status: Former    Current packs/day: 0.00    Average packs/day: 1 pack/day for 30.0 years (30.0 ttl pk-yrs)    Types: Cigarettes    Start date: 07/05/1980    Quit date: 07/05/2010    Years since quitting: 13.3   Smokeless tobacco: Never  Vaping Use   Vaping status: Never Used  Substance Use Topics   Alcohol use: No   Drug use: No   Family History  Problem Relation Age of Onset   Breast cancer Sister 79   Thyroid  disease Sister    Heart failure Sister        rheumatic fever   Rheumatic fever Sister    Diabetes Mother    Liver disease Mother    Cirrhosis Mother    Allergies  Allergen Reactions   Penicillins Hives   Adhesive [Tape] Rash    band-aid   Enoxaparin Rash    ROS    Objective:    BP 122/78   Pulse (!) 55   Temp 98.5 F (36.9 C)   Ht 4' 11.75" (1.518 m)   Wt 209 lb 8 oz (95 kg)   LMP 02/27/2010   SpO2 98%   BMI 41.26 kg/m  BP Readings from Last 3 Encounters:  11/16/23 122/78  11/16/23 (!) 144/86  08/25/23 136/74   Wt Readings from Last 3 Encounters:  11/16/23 209 lb 8 oz (95 kg)  11/16/23 209 lb (94.8 kg)  08/25/23 210 lb 4.8 oz (95.4  kg)  HENT:  Head: Normocephalic and atraumatic. No masses.  Right Ear: Tympanic membrane, ear canal and external ear normal.  Left Ear: Tympanic membrane, ear canal and external ear normal.  Nose: Nose normal.  Mouth/Throat:  Mouth: Mucous membranes are moist.  Pharynx: Oropharynx is clear. No oropharyngeal exudate or posterior oropharyngeal erythema.  Eyes:  General: Lids are normal.  Right eye: No discharge.  Left eye: No discharge.  Extraocular Movements: Extraocular movements intact.  Conjunctiva/sclera: Conjunctivae normal.  Right eye: Right conjunctiva is not injected.  Left eye: Left conjunctiva is not injected.  Pupils: Pupils are equal, round, and reactive to light.  Neck:  Trachea: Trachea normal.  Cardiovascular:  Rate and Rhythm: Normal rate and regular rhythm.  Pulses: Normal pulses.  Heart sounds: Normal heart sounds. No murmur heard. No friction rub. No gallop.  Pulmonary:  Effort: Pulmonary effort is normal.  Breath sounds: Normal breath sounds. No decreased breath sounds, wheezing, rhonchi or rales.  Abdominal:  General: Abdomen is flat. Bowel sounds are normal.  Palpations: Abdomen is soft.  Right lower leg: No edema.  Left lower leg: No edema.  Skin: General: Skin is warm and dry.  Findings: No  rash.  Neurological:  General: No focal deficit present.  Mental Status: She is alert and oriented to person, place, and time. Mental status is at baseline.  Sensory: Sensation is intact.  Motor: Motor function is intact.  Coordination: Coordination is intact.  Psychiatric:  Mood and Affect: Mood normal.  Behavior: Behavior normal. Behavior is cooperative.  Thought Content: Thought content normal.  Judgment: Judgment normal.     No results found for any visits on 11/16/23.

## 2023-11-17 ENCOUNTER — Encounter: Payer: Self-pay | Admitting: Nurse Practitioner

## 2023-11-17 LAB — CYTOLOGY - PAP
Comment: NEGATIVE
Diagnosis: NEGATIVE
High risk HPV: NEGATIVE

## 2023-11-18 ENCOUNTER — Ambulatory Visit: Payer: MEDICARE | Admitting: Nurse Practitioner

## 2023-11-23 NOTE — Assessment & Plan Note (Signed)
 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  Lymphovascular invasion was not identified.  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 Any adjuvant therapy is preventative/to reduce recurrence risk -She completed b/l breast radiation 04/15/23 and is recovering.  -Given her extensive history and that the component of invasive lobular carcinoma on biopsy was strongly ER and PR positive, we do recommend adjuvant aromatase inhibitor to reduce her risk of cancer recurrence,  The potential benefit and side effects, which includes but not limited to, hot flash, skin and vaginal dryness, metabolic changes ( increased blood glucose, cholesterol, weight, etc.), slightly in increased risk of cardiovascular disease, cataracts, muscular and joint discomfort, osteopenia and osteoporosis, etc, were discussed with her in great details. She is interested,  -Due to her concern for thrombosis in the setting of Afib, will reserve Tamoxifen as last resort. I recommend Anastrozole. She agrees to try -Will start in 3-4 weeks, after she recovers from RT.  -08/25/2023 - patient had not yet started on AI. Will start this upcoming weekend.  -10/27/2023 -bilateral diagnostic mammogram was benign.  Recommend bilateral  diagnostic mammogram in January 2026. -- Scheduled for DEXA scan in March 2025.

## 2023-11-23 NOTE — Progress Notes (Unsigned)
 Patient Care Team: Bernadette Hoit, MD as PCP - General (Family Medicine) [name], MD as Consulting Physician (General Surgery) Malachy Mood, MD as Consulting Physician (Hematology)  Clinic Day:  11/24/2023  Referring physician: Bernadette Hoit, MD  ASSESSMENT & PLAN:   Assessment & Plan: Malignant neoplasm of lower-inner quadrant of right breast of female, estrogen receptor positive (HCC)  ILC and LCIS, G2, stage IA (pT1aN0), ER/PR + HER2 - Ki67 5%, cT1a;  -Screening detected R breast distortion and asymmetry spanning 1.6 cm in the lower inner quadrant, biopsy 11/28/22 revealed invasive lobular carcinoma and intermediate grade LCIS.  Lymphovascular invasion was not identified.  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 Any adjuvant therapy is preventative/to reduce recurrence risk -She completed b/l breast radiation 04/15/23 and is recovering.  -Given her extensive history and that the component of invasive lobular carcinoma on biopsy was strongly ER and PR positive, we do recommend adjuvant aromatase inhibitor to reduce her risk of cancer recurrence,  The potential benefit and side effects, which includes but not limited to, hot flash, skin and vaginal dryness, metabolic changes ( increased blood glucose, cholesterol, weight, etc.), slightly in increased risk of cardiovascular disease, cataracts, muscular and joint discomfort, osteopenia and osteoporosis, etc, were discussed  with her in great details. She is interested,  -Due to her concern for thrombosis in the setting of Afib, will reserve Tamoxifen as last resort. I recommend Anastrozole. She agrees to try -Will start in 3-4 weeks, after she recovers from RT.  -08/25/2023 - patient had not yet started on AI. Will start this upcoming weekend.  -10/27/2023 -bilateral diagnostic mammogram was benign.  Recommend bilateral diagnostic mammogram in January 2026. -- Scheduled for DEXA scan in March 2025.  Lobular carcinoma in situ (LCIS) of left breast -Diagnosed in 09/2017. S/p left breast lumpectomy. She had complete resection and no invasive cancer.  -She declined chemoprevention with anti-estrogen therapy. -She has been on surveillance with annual mammograms. Additional screening breast MRI was recommended, but she declined due to claustrophobia.  -Pre-op MRI showed additional masses, biopsy 12/24/22 showed multifocal (x2) DCIS with calcifications and necrosis -S/p bilateral lumpectomy 01/22/23, left breast path showed focal ADH, ALH and LCIS, no invasive carcinoma  -She completed bilateral radiation at [facility] 04/15/23, she tolerated with expected dermatitis which she is managing -started anastrozole 1 mg daily 08/2023 -Benign bilateral diagnostic mammogram 10/27/2023. -contrast enhanced diagnostic mammogram to be scheduled for 03/2024     Plan:  Reviewed labs, all within normal limits.  Was able to start anastrozole 08/2023. She should continue to take this daily  Will order contrast enhanced mammogram for July 2025.  Labs with follow up should be scheduled for 6 months. She should follow up sooner if needed.   The patient understands the plans discussed today and is in agreement with them.  She knows to contact our office if she develops concerns prior to her next appointment.  I provided 25 minutes of face-to-face time during this encounter and > 50%  was spent counseling as documented under my assessment and plan.     Carlean Jews, NP  [facility] - A DEPT OF [facility] [address] [place] [address] Dept: [phone] Dept Fax: [phone]   Orders Placed This Encounter  Procedures   MM 3D DIAG BILAT WITH CONTRAST BCG ONLY    Standing Status:   Future    Expected Date:   05/23/2024    Expiration Date:   11/23/2024    Reason for Exam (SYMPTOM  OR DIAGNOSIS REQUIRED):   hx bilateral breast cancer with high risk recurrence (LCIS bilateral)    If indicated for the ordered procedure, I authorize the administration of contrast media per Radiology protocol:   Yes    Does the patient have a contrast media/X-ray dye allergy?:   No    Preferred Imaging [facility]?:   [facility]      CHIEF COMPLAINT:  CC: Right breast cancer, estrogen receptor positive.  History of left breast LCIS  Current Treatment: Anastrozole 1 mg daily, started 08/2023  INTERVAL HISTORY:  Theresa Duncan is here today for repeat clinical assessment.  She was last seen by myself on 08/25/2023.  Most recent diagnostic mammogram done 10/27/2023.  Results were benign with recommendation to repeat diagnostic bilateral mammogram in 1 year.  She is scheduled for DEXA scan in March 2025.  She started taking anastrozole in December 2024.  Has had minimal side effects.  Initially noted some left shin pain when she first started taking it, but this has resolved.  She denies chest pain, chest pressure, or shortness of breath. She denies headaches or visual disturbances. He denies abdominal pain, nausea, vomiting, or changes in bowel or bladder habits.  She denies fevers or chills. She denies pain. Her appetite is good. Her weight has increased 2 pounds over last week .  I have reviewed the past medical history, past surgical history, social history and family history with the patient and they are unchanged from previous note.  ALLERGIES:  is allergic to penicillins, adhesive  [tape], and enoxaparin.  MEDICATIONS:  Current Outpatient Medications  Medication Sig Dispense Refill   acetaminophen (TYLENOL) 500 MG tablet Take 1,000 mg by mouth every 6 (six) hours as needed for moderate pain or mild pain.     ALPRAZolam (XANAX) 0.25 MG tablet Take 1 tablet (0.25 mg total) by mouth daily as needed for anxiety. 30 tablet 1   anastrozole (ARIMIDEX) 1 MG tablet TAKE 1 TABLET BY MOUTH EVERY DAY 90 tablet 1   aspirin EC 81 MG tablet Take 81 mg by mouth daily.     Cholecalciferol (VITAMIN D3) 2000 UNITS TABS Take 2,000 Units by mouth daily.     diltiazem (CARDIZEM CD) 180 MG 24 hr capsule Take 180 mg by mouth daily.     flecainide (TAMBOCOR) 100 MG tablet Take 50-100 mg by mouth See admin instructions. Take 100 mg in the am and 50 mg at bedtime     GLUCOSAMINE SULFATE PO Take 1,500 mg by mouth daily.     ibuprofen (ADVIL,MOTRIN) 200 MG tablet Take 200 mg by mouth every 6 (six) hours as needed for pain.     levothyroxine (SYNTHROID) 112 MCG tablet Take 112 mcg by mouth daily before breakfast.     losartan (COZAAR) 25 MG tablet Take 25 mg by mouth daily.     meclizine (ANTIVERT) 25 MG tablet Take 25 mg by mouth 3 (  three) times daily as needed for dizziness.     metoprolol succinate (TOPROL-XL) 25 MG 24 hr tablet Take 25 mg by mouth daily.     Multiple Vitamin (MULTIVITAMIN) tablet Take 1 tablet by mouth daily.     rosuvastatin (CRESTOR) 5 MG tablet Take 1 tablet (5 mg total) by mouth daily. 90 tablet 1   trolamine salicylate (ASPERCREME) 10 % cream Apply 1 Application topically daily as needed (lower back pain).     valACYclovir (VALTREX) 500 MG tablet Take 500 mg by mouth daily as needed (Flare-up).     No current facility-administered medications for this visit.    HISTORY OF PRESENT ILLNESS:   Oncology History Overview Note  Cancer Staging No matching staging information was found for the patient.      Lobular carcinoma in situ (LCIS) of left breast  09/15/2017  Mammogram   Mammogram 09/15/17 IMPRESSION: Suspicious distortion at 12 o'clock in the left breast.   RECOMMENDATION: Recommend stereotactic biopsy of the left breast distortion    10/02/2017 Initial Biopsy   Diagnosis 10/02/17 Breast, left, needle core biopsy, UOQ at anterior depth - MAMMARY CARCINOMA IN-SITU ARISING IN A COMPLEX SCLEROSING LESION - ATYPICAL LOBULAR HYPERPLASIA - CALCIFICATIONS - SEE COMMENT   11/18/2017 Surgery   LEFT BREAST LUMPECTOMY WITH RADIOACTIVE SEED LOCALIZATION by Dr Donell Beers 11/18/17    11/18/2017 Pathology Results   Diagnosis 11/18/17 Breast, lumpectomy, Left - LOBULAR CARCINOMA IN-SITU ARISING IN A COMPLEX SCLEROSING LESION - INTRADUCTAL PAPILLOMA - PREVIOUS BIOPSY SITE CHANGES - CALCIFICATIONS - SEE COMMENT   12/18/2017 Initial Diagnosis   Lobular carcinoma in situ (LCIS) of left breast    Anti-estrogen oral therapy   She declined antiestrogen therapy.    10/31/2022 Mammogram   Diagnostic mammogram right breast with ultrasound IMPRESSION: 1. Persistent focal asymmetry with associated subtle architectural distortion and scattered calcifications involving the LOWER INNER QUADRANT of the RIGHT breast without sonographic correlate. 2. Possible second focus of architectural distortion in the central RIGHT breast (retroareolar location at middle depth) identified on the full field mediolateral view, again without sonographic correlate. There is no correlate on the screening CC or MLO views. 3. No pathologic RIGHT axillary lymphadenopathy.  RECOMMENDATION: Stereotactic tomosynthesis core needle biopsy of the asymmetry/distortion associated with calcifications in the LOWER INNER QUADRANT of the RIGHT breast and the second focus of architectural distortion in the central RIGHT breast (if this persists at the time of biopsy).   12/12/2022 Imaging   MRI bilateral breast IMPRESSION: 1. Biopsy-proven malignancy within the LOWER RIGHT breast, with entire area of  non masslike enhancement and nodular components measuring 3 x 2.5 x 2.5 cm. 2. Indeterminate 4 cm area of non masslike enhancement within the anterior to middle depth UPPER RIGHT breast. Tissue sampling recommended. 3. Indeterminate 2.5 cm anterior LOWER INNER LEFT breast non masslike enhancement. Tissue sampling recommended. 4. Indeterminate 3.5 cm non masslike enhancement with some nodular components in the UPPER LEFT breast. Tissue sampling recommended.   RECOMMENDATION: MR guided biopsies of indeterminate UPPER RIGHT breast non masslike enhancement, indeterminate LOWER LEFT breast non masslike enhancement and indeterminate UPPER LEFT breast non masslike enhancement.   BI-RADS CATEGORY  4: Suspicious   10/27/2023 Mammogram   3D bilateral diagnostic mammogram  IMPRESSION: 1. Expected postsurgical changes in the bilateral breasts. 2. No mammographic evidence of malignancy bilaterally.   RECOMMENDATION: Diagnostic bilateral mammogram in 1 year.  BI-RADS CATEGORY  2: Benign.         REVIEW OF SYSTEMS:   Constitutional:  Denies fevers, chills or abnormal weight loss. Mild fatigue  Eyes: Denies blurriness of vision Ears, nose, mouth, throat, and face: Denies mucositis or sore throat Respiratory: Denies cough, dyspnea or wheezes Cardiovascular: Denies palpitation, chest discomfort or lower extremity swelling Gastrointestinal:  Denies nausea, heartburn or change in bowel habits Skin: Denies abnormal skin rashes Lymphatics: Denies new lymphadenopathy or easy bruising Neurological:Denies numbness, tingling or new weaknesses Behavioral/Psych: Mood is stable, no new changes  All other systems were reviewed with the patient and are negative.   VITALS:   Today's Vitals   11/24/23 1316  BP: 134/80  Pulse: (!) 58  Resp: 19  Temp: (!) 97.3 F (36.3 C)  TempSrc: Temporal  SpO2: 97%  Weight: 211 lb 4.8 oz (95.8 kg)   Body mass index is 41.61 kg/m.   Wt Readings from Last 3  Encounters:  11/24/23 211 lb 4.8 oz (95.8 kg)  11/16/23 209 lb 8 oz (95 kg)  11/16/23 209 lb (94.8 kg)    Body mass index is 41.61 kg/m.  Performance status (ECOG): 1 - Symptomatic but completely ambulatory  PHYSICAL EXAM:   GENERAL:alert, no distress and comfortable SKIN: skin color, texture, turgor are normal, no rashes or significant lesions EYES: normal, Conjunctiva are pink and non-injected, sclera clear OROPHARYNX:no exudate, no erythema and lips, buccal mucosa, and tongue normal  NECK: supple, thyroid normal size, non-tender, without nodularity LYMPH:  no palpable lymphadenopathy in the cervical, axillary or inguinal LUNGS: clear to auscultation and percussion with normal breathing effort HEART: regular rate & rhythm and no murmurs and no lower extremity edema ABDOMEN:abdomen soft, non-tender and normal bowel sounds Musculoskeletal:no cyanosis of digits and no clubbing  NEURO: alert & oriented x 3 with fluent speech, no focal motor/sensory deficits BREAST EXAM: No nipple discharge or inversion.  S/p left lumpectomy. Firm scar tissue beneath lumpectomy scar, adjacent to nipple.  Right breast lumpectomy scars have healed well, including right axillary surgical scar. Bilateral breasts fibrous in texture without palpable masses or lumps.  There is no axillary lymphadenopathy bilaterally   LABORATORY DATA:  I have reviewed the data as listed    Component Value Date/Time   NA 140 11/24/2023 1157   NA 141 01/12/2012 0000   K 4.6 11/24/2023 1157   CL 103 11/24/2023 1157   CO2 28 11/24/2023 1157   GLUCOSE 93 11/24/2023 1157   BUN 14 11/24/2023 1157   BUN 12 01/12/2012 0000   CREATININE 0.69 11/24/2023 1157   CALCIUM 9.9 11/24/2023 1157   PROT 7.9 11/24/2023 1157   ALBUMIN 4.5 11/24/2023 1157   AST 15 11/24/2023 1157   ALT 18 11/24/2023 1157   ALKPHOS 58 11/24/2023 1157   BILITOT 0.4 11/24/2023 1157   GFRNONAA >60 11/24/2023 1157    Lab Results  Component Value Date    WBC 7.7 11/24/2023   NEUTROABS 5.3 11/24/2023   HGB 14.3 11/24/2023   HCT 42.4 11/24/2023   MCV 94.2 11/24/2023   PLT 231 11/24/2023     RADIOGRAPHIC STUDIES: MM DIAG BREAST TOMO BILATERAL Result Date: 10/27/2023 CLINICAL DATA:  68 year old female presenting for annual exam. History of bilateral lumpectomies in 2024. No new problems today. EXAM: DIGITAL DIAGNOSTIC BILATERAL MAMMOGRAM WITH TOMOSYNTHESIS AND CAD TECHNIQUE: Bilateral digital diagnostic mammography and breast tomosynthesis was performed. The images were evaluated with computer-aided detection. COMPARISON:  Previous exam(s). ACR Breast Density Category b: There are scattered areas of fibroglandular density. FINDINGS: Right breast: A spot 2D magnification view of the lumpectomy site was performed  in addition to standard views. There are expected postsurgical changes. No suspicious mass, distortion, or microcalcifications are identified to suggest presence of malignancy. Left breast: A spot 2D magnification view of the lumpectomy site was performed in addition to standard views. There are expected postsurgical changes. No suspicious mass, distortion, or microcalcifications are identified to suggest presence of malignancy. IMPRESSION: 1. Expected postsurgical changes in the bilateral breasts. 2. No mammographic evidence of malignancy bilaterally. RECOMMENDATION: Diagnostic bilateral mammogram in 1 year. I have discussed the findings and recommendations with the patient. If applicable, a reminder letter will be sent to the patient regarding the next appointment. BI-RADS CATEGORY  2: Benign. Electronically Signed   By: Emmaline Kluver M.D.   On: 10/27/2023 16:31

## 2023-11-23 NOTE — Assessment & Plan Note (Signed)
-  Diagnosed in 09/2017. S/p left breast lumpectomy. She had complete resection and no invasive cancer.  -She declined chemoprevention with anti-estrogen therapy. -She has been on surveillance with annual mammograms. Additional screening breast MRI was recommended, but she declined due to claustrophobia.  -Pre-op MRI showed additional masses, biopsy 12/24/22 showed multifocal (x2) DCIS with calcifications and necrosis -S/p bilateral lumpectomy 01/22/23, left breast path showed focal ADH, ALH and LCIS, no invasive carcinoma  -She completed bilateral radiation at [facility] 04/15/23, she tolerated with expected dermatitis which she is managing -started anastrozole 1 mg daily 08/2023 -Benign bilateral diagnostic mammogram 10/27/2023. -contrast enhanced diagnostic mammogram to be scheduled for 03/2024

## 2023-11-24 ENCOUNTER — Inpatient Hospital Stay: Payer: MEDICARE | Attending: Nurse Practitioner | Admitting: Nurse Practitioner

## 2023-11-24 ENCOUNTER — Encounter: Payer: Self-pay | Admitting: Nurse Practitioner

## 2023-11-24 ENCOUNTER — Inpatient Hospital Stay: Payer: MEDICARE

## 2023-11-24 VITALS — BP 134/80 | HR 58 | Temp 97.3°F | Resp 19 | Wt 211.3 lb

## 2023-11-24 DIAGNOSIS — Z1732 Human epidermal growth factor receptor 2 negative status: Secondary | ICD-10-CM | POA: Diagnosis not present

## 2023-11-24 DIAGNOSIS — C50311 Malignant neoplasm of lower-inner quadrant of right female breast: Secondary | ICD-10-CM | POA: Insufficient documentation

## 2023-11-24 DIAGNOSIS — Z923 Personal history of irradiation: Secondary | ICD-10-CM | POA: Diagnosis not present

## 2023-11-24 DIAGNOSIS — D0502 Lobular carcinoma in situ of left breast: Secondary | ICD-10-CM

## 2023-11-24 DIAGNOSIS — Z1721 Progesterone receptor positive status: Secondary | ICD-10-CM | POA: Diagnosis not present

## 2023-11-24 DIAGNOSIS — Z170 Estrogen receptor positive status [ER+]: Secondary | ICD-10-CM | POA: Insufficient documentation

## 2023-11-24 DIAGNOSIS — Z79811 Long term (current) use of aromatase inhibitors: Secondary | ICD-10-CM | POA: Diagnosis not present

## 2023-11-24 LAB — CMP (CANCER CENTER ONLY)
ALT: 18 U/L (ref 0–44)
AST: 15 U/L (ref 15–41)
Albumin: 4.5 g/dL (ref 3.5–5.0)
Alkaline Phosphatase: 58 U/L (ref 38–126)
Anion gap: 9 (ref 5–15)
BUN: 14 mg/dL (ref 8–23)
CO2: 28 mmol/L (ref 22–32)
Calcium: 9.9 mg/dL (ref 8.9–10.3)
Chloride: 103 mmol/L (ref 98–111)
Creatinine: 0.69 mg/dL (ref 0.44–1.00)
GFR, Estimated: >60 mL/min (ref >60)
Glucose, Bld: 93 mg/dL (ref 70–99)
Potassium: 4.6 mmol/L (ref 3.5–5.1)
Sodium: 140 mmol/L (ref 135–145)
Total Bilirubin: 0.4 mg/dL (ref 0.0–1.2)
Total Protein: 7.9 g/dL (ref 6.5–8.1)

## 2023-11-24 LAB — CBC WITH DIFFERENTIAL (CANCER CENTER ONLY)
Abs Immature Granulocytes: 0.01 10*3/uL (ref 0.00–0.07)
Basophils Absolute: 0 10*3/uL (ref 0.0–0.1)
Basophils Relative: 1 %
Eosinophils Absolute: 0.2 10*3/uL (ref 0.0–0.5)
Eosinophils Relative: 3 %
HCT: 42.4 % (ref 36.0–46.0)
Hemoglobin: 14.3 g/dL (ref 12.0–15.0)
Immature Granulocytes: 0 %
Lymphocytes Relative: 20 %
Lymphs Abs: 1.5 10*3/uL (ref 0.7–4.0)
MCH: 31.8 pg (ref 26.0–34.0)
MCHC: 33.7 g/dL (ref 30.0–36.0)
MCV: 94.2 fL (ref 80.0–100.0)
Monocytes Absolute: 0.6 10*3/uL (ref 0.1–1.0)
Monocytes Relative: 8 %
Neutro Abs: 5.3 10*3/uL (ref 1.7–7.7)
Neutrophils Relative %: 68 %
Platelet Count: 231 10*3/uL (ref 150–400)
RBC: 4.5 MIL/uL (ref 3.87–5.11)
RDW: 12.7 % (ref 11.5–15.5)
WBC Count: 7.7 10*3/uL (ref 4.0–10.5)
nRBC: 0 % (ref 0.0–0.2)

## 2023-12-07 ENCOUNTER — Ambulatory Visit: Payer: MEDICARE | Attending: General Surgery

## 2023-12-07 VITALS — Wt 210.1 lb

## 2023-12-07 DIAGNOSIS — Z483 Aftercare following surgery for neoplasm: Secondary | ICD-10-CM | POA: Insufficient documentation

## 2023-12-07 NOTE — Therapy (Signed)
 OUTPATIENT PHYSICAL THERAPY SOZO SCREENING NOTE   Patient Name: Theresa Duncan MRN: [id number] DOB:Sep 29, 1956, 68 y.o., female Today's Date: 12/07/2023  PCP: Bernadette Hoit, MD REFERRING PROVIDER: [name], MD   PT End of Session - 12/07/23 [phone]     Visit Number 3   # unchanged due to screen only   PT Start Time 0928    PT Stop Time 0933    PT Time Calculation (min) 5 min    Activity Tolerance Patient tolerated treatment well    Behavior During Therapy [facility] for tasks assessed/performed             Past Medical History:  Diagnosis Date   Anxiety    chronic, panic attacks   Atrial fibrillation (HCC) 06/02/2010   treated with Cardiazem and Lovenox at [facility], now [facility]   Cancer [facility])    breast   Colon polyp    tubular adenoma   Hypertension    Hypothyroidism    Migraine    Personal history of radiation therapy    Tachycardia 05/2010   Thyroid disease 05/2010   hypothyroid   Past Surgical History:  Procedure Laterality Date   APPENDECTOMY  1967   BREAST BIOPSY     BREAST BIOPSY Right 11/28/2022   MM RT BREAST BX W LOC DEV 1ST LESION IMAGE BX SPEC STEREO GUIDE 11/28/2022 [facility]   BREAST BIOPSY  01/20/2023   MM RT RADIOACTIVE SEED LOC MAMMO GUIDE 01/20/2023 [facility]   BREAST BIOPSY  01/20/2023   MM RT RADIOACTIVE SEED EA ADD LESION LOC MAMMO GUIDE 01/20/2023 [facility]   BREAST BIOPSY  01/20/2023   MM LT RADIOACTIVE SEED LOC MAMMO GUIDE 01/20/2023 [facility]   BREAST BIOPSY  01/20/2023   MM LT RADIOACTIVE SEED EA ADD LESION LOC MAMMO GUIDE 01/20/2023 [facility]   BREAST LUMPECTOMY Left 10/2017   BREAST LUMPECTOMY Bilateral 01/22/2023   Right-ILC, Left-DCIS, ALH   BREAST LUMPECTOMY WITH RADIOACTIVE SEED AND SENTINEL LYMPH NODE BIOPSY Right 01/22/2023   Procedure: RIGHT BREAST LUMPECTOMY WITH RADIOACTIVE SEED X2 AND SENTINEL LYMPH NODE BIOPSY;  Surgeon: [name], MD;  Location: [facility] OR;  Service: General;   Laterality: Right;   BREAST LUMPECTOMY WITH RADIOACTIVE SEED LOCALIZATION Left 11/18/2017   Procedure: LEFT BREAST LUMPECTOMY WITH RADIOACTIVE SEED LOCALIZATION;  Surgeon: [name], MD;  Location: [facility];  Service: General;  Laterality: Left;   BREAST LUMPECTOMY WITH RADIOACTIVE SEED LOCALIZATION Left 01/22/2023   Procedure: LEFT BREAST LUMPECTOMY WITH RADIOACTIVE SEED LOCALIZATION X2;  Surgeon: [name], MD;  Location: [facility] OR;  Service: General;  Laterality: Left;   HERNIA REPAIR N/A    WISDOM TOOTH EXTRACTION     Patient Active Problem List   Diagnosis Date Noted   Greater trochanteric bursitis of right hip 09/21/2023   Gallstone pancreatitis 07/03/2023   Malignant neoplasm of lower-inner quadrant of right breast of female, estrogen receptor positive (HCC) 12/12/2022   Adenomatous polyp of colon 12/08/2022   Claustrophobia 12/08/2022   S/P hernia repair 05/20/2021   Small bowel obstruction (HCC) 05/19/2021   Atrial flutter (HCC) 03/08/2021   Lobular carcinoma in situ (LCIS) of left breast 12/18/2017   Situational anxiety 10/15/2017   Palpitations 10/01/2016   Hair loss 07/15/2016   Seborrheic keratoses 07/15/2016   Hyperlipidemia 12/20/2014   Atrial fibrillation (HCC) 05/16/2014   Essential hypertension 05/16/2014   PVCs (premature ventricular contractions) 05/16/2014   Chronic anxiety 07/05/2013   Other specified hypothyroidism 07/05/2013   Morbid  obesity (HCC) 07/05/2013   Vitamin D deficiency 07/05/2013    REFERRING DIAG: left breast cancer at risk for lymphedema  THERAPY DIAG:  Aftercare following surgery for neoplasm  PERTINENT HISTORY: Hx of Lt LCIS in 2018 with lumpectomy and no nodes removed. Now 2 spots on the Rt and 2 spots on the left. Underwent Lt lumpectomy 01/22/23, x 2 Rt lumpectomy x 2 and Rt SLNB with 2 negative nodes removed. Only the Rt side was cancer. Will do radiation bilaterally x 3 weeks.   PRECAUTIONS: right UE Lymphedema  risk  SUBJECTIVE: Pt returns for her 3 month L-Dex screen.   PAIN:  Are you having pain? No  SOZO SCREENING: Patient was assessed today using the SOZO machine to determine the lymphedema index score. This was compared to her baseline score. It was determined that she is within the recommended range when compared to her baseline and no further action is needed at this time. She will continue SOZO screenings. These are done every 3 months for 2 years post operatively followed by every 6 months for 2 years, and then annually.   L-DEX FLOWSHEETS - 12/07/23 0900       L-DEX LYMPHEDEMA SCREENING   Measurement Type Unilateral    L-DEX MEASUREMENT EXTREMITY Upper Extremity    POSITION  Standing    DOMINANT SIDE Right    At Risk Side Right    BASELINE SCORE (UNILATERAL) -4.2    L-DEX SCORE (UNILATERAL) -3.1    VALUE CHANGE (UNILAT) 1.1             Berna Spare, PTA 12/07/23 9:32 AM

## 2023-12-20 ENCOUNTER — Encounter: Payer: Self-pay | Admitting: Family Medicine

## 2023-12-21 ENCOUNTER — Other Ambulatory Visit: Payer: Self-pay

## 2023-12-21 MED ORDER — LOSARTAN POTASSIUM 25 MG PO TABS: 25.0000 mg | ORAL_TABLET | Freq: Every day | ORAL | 0 refills | Status: DC

## 2023-12-25 ENCOUNTER — Ambulatory Visit
Admission: RE | Admit: 2023-12-25 | Discharge: 2023-12-25 | Disposition: A | Discharge status: Home / Self Care | Payer: MEDICARE | Source: Ambulatory Visit | Attending: Nurse Practitioner | Admitting: Nurse Practitioner

## 2023-12-25 DIAGNOSIS — C50311 Malignant neoplasm of lower-inner quadrant of right female breast: Secondary | ICD-10-CM

## 2023-12-25 DIAGNOSIS — D0502 Lobular carcinoma in situ of left breast: Secondary | ICD-10-CM

## 2024-01-13 ENCOUNTER — Ambulatory Visit: Payer: MEDICARE | Admitting: Family Medicine

## 2024-01-13 ENCOUNTER — Encounter: Payer: Self-pay | Admitting: Family Medicine

## 2024-01-13 VITALS — BP 126/86 | HR 60 | Temp 98.0°F | Ht 59.75 in | Wt 211.1 lb

## 2024-01-13 DIAGNOSIS — G459 Transient cerebral ischemic attack, unspecified: Secondary | ICD-10-CM | POA: Diagnosis not present

## 2024-01-13 DIAGNOSIS — E785 Hyperlipidemia, unspecified: Secondary | ICD-10-CM

## 2024-01-13 DIAGNOSIS — F418 Other specified anxiety disorders: Secondary | ICD-10-CM

## 2024-01-13 DIAGNOSIS — D2339 Other benign neoplasm of skin of other parts of face: Secondary | ICD-10-CM | POA: Insufficient documentation

## 2024-01-13 DIAGNOSIS — L814 Other melanin hyperpigmentation: Secondary | ICD-10-CM | POA: Insufficient documentation

## 2024-01-13 DIAGNOSIS — M67441 Ganglion, right hand: Secondary | ICD-10-CM | POA: Insufficient documentation

## 2024-01-13 DIAGNOSIS — Z6841 Body Mass Index (BMI) 40.0 and over, adult: Secondary | ICD-10-CM

## 2024-01-13 DIAGNOSIS — I4891 Unspecified atrial fibrillation: Secondary | ICD-10-CM

## 2024-01-13 DIAGNOSIS — D1801 Hemangioma of skin and subcutaneous tissue: Secondary | ICD-10-CM | POA: Insufficient documentation

## 2024-01-13 DIAGNOSIS — I10 Essential (primary) hypertension: Secondary | ICD-10-CM | POA: Diagnosis not present

## 2024-01-13 DIAGNOSIS — D485 Neoplasm of uncertain behavior of skin: Secondary | ICD-10-CM | POA: Insufficient documentation

## 2024-01-13 DIAGNOSIS — D2230 Melanocytic nevi of unspecified part of face: Secondary | ICD-10-CM | POA: Insufficient documentation

## 2024-01-13 MED ORDER — FLECAINIDE ACETATE 100 MG PO TABS: 50.0000 mg | ORAL_TABLET | Freq: Two times a day (BID) | ORAL | 0 refills | Status: AC

## 2024-01-13 NOTE — Progress Notes (Signed)
 Patient Office Visit  Assessment & Plan:  TIA (transient ischemic attack)  Essential hypertension  Atrial fibrillation, unspecified type (HCC)  Hyperlipidemia, unspecified hyperlipidemia type  BMI 40.0-44.9, adult (HCC)  Situational anxiety   Test results were reviewed and analyzed as part of the medical decision making of this visit.  Reviewed [facility] notes during the office visit, diagnostic/imaging studies and lab work. Patient has upcoming visit in May with me.  Recommend healthy diet i.e mediterranean/DASH diet, consistent exercise - 30 minutes 5 day per week, and gradual weight loss. We briefly discussed trying one of the new GLP-1's i.e. Wegovy or Zepbound to help with weight loss however patient has had a previous history of pancreatitis.  No follow-ups on file.   Subjective:    Patient ID: Theresa Duncan, female    DOB: [birth year]  Age: 68 y.o. MRN: [id number]  Chief Complaint  Patient presents with   Hospitalization Follow-up    HPI Follow up TIA, was seen in [facility] ER on April 10th Patient is back to normal but it was scary event and she has decided to start the Eliquis to prevent future TIA/strokes.  It was determined that this TIA was from atrial fibrillation Pt also Cardiology yesterday in [facility].  Reviewed cardiology note from yesterday.   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 Rudolpho Sevin --Continue Metoprolol 25mg  daily --If her burden were to increase, consider Tikosyn load vs Catheter ablation Follow Up --Follow up with  Neurology as scheduled in May --Follow up with Dr. Rudolpho Sevin as scheduled in July  Atrial fibrillation-patient did see cardiology yesterday and med was increased to flecainide 100 mg twice a day, and now taking the Eliquis 5 mg twice a day.  Cardiac ablation was briefly discussed as a future option but patient would like to discuss this with Dr. Rudolpho Sevin at the next office visit.  Was also told to continue Toprol-XL 25 mg once a day, Diltiazem CD 180mg  PO every day and losartan 25 mg once a day. HTN-using antihypertensive medication without difficulty.  Denies associated signs and symptoms including chest pain, shortness of breath, cough headache, peripheral swelling cramps spasms and palpitations.  Voices understanding of the potential for interference with blood pressure control with substances including high sodium intake, decongestions, herbal supplements weight loss supplements nutritional supplements.  Blood pressures at home are less than 140/90.  BP has been stable so far.  Hyperlipidemia-denies unusual muscle aches or muscle cramps or difficulty tolerating statin therapy.  Aware of need for diet control, exercise and healthy eating.  Patient is aware not to consume grapefruit juice or pomegranate juice.  Crestor was increased from 5 mg to 10 mg to achieve an LDL goal less than 70  The ASCVD Risk score (Arnett DK, et al., 2019) failed to calculate for the following reasons:   Risk score cannot be calculated because patient has a medical history suggesting prior/existing ASCVD  Past Medical History:  Diagnosis Date   Anxiety    chronic, panic attacks   Atrial fibrillation (HCC) 06/02/2010   treated with Cardiazem and Lovenox at [facility],  now ASA   Cancer (HCC)    breast   Colon polyp    tubular adenoma   Hypertension    Hypothyroidism    Migraine    Personal history of radiation therapy    Tachycardia 05/2010   Thyroid disease 05/2010   hypothyroid   Past Surgical History:  Procedure  Laterality Date   APPENDECTOMY  1967   BREAST BIOPSY     BREAST BIOPSY Right 11/28/2022   MM RT BREAST BX W LOC DEV 1ST LESION IMAGE BX SPEC STEREO GUIDE 11/28/2022 [facility]   BREAST BIOPSY  01/20/2023   MM RT RADIOACTIVE SEED LOC MAMMO GUIDE 01/20/2023 [facility]   BREAST BIOPSY  01/20/2023   MM RT RADIOACTIVE SEED EA ADD LESION LOC MAMMO GUIDE 01/20/2023 [facility]   BREAST BIOPSY  01/20/2023   MM LT RADIOACTIVE SEED LOC MAMMO GUIDE 01/20/2023 [facility]   BREAST BIOPSY  01/20/2023   MM LT RADIOACTIVE SEED EA ADD LESION LOC MAMMO GUIDE 01/20/2023 [facility]   BREAST LUMPECTOMY Left 10/2017   BREAST LUMPECTOMY Bilateral 01/22/2023   Right-ILC, Left-DCIS, ALH   BREAST LUMPECTOMY WITH RADIOACTIVE SEED AND SENTINEL LYMPH NODE BIOPSY Right 01/22/2023   Procedure: RIGHT BREAST LUMPECTOMY WITH RADIOACTIVE SEED X2 AND SENTINEL LYMPH NODE BIOPSY;  Surgeon: [name], MD;  Location: [facility] OR;  Service: General;  Laterality: Right;   BREAST LUMPECTOMY WITH RADIOACTIVE SEED LOCALIZATION Left 11/18/2017   Procedure: LEFT BREAST LUMPECTOMY WITH RADIOACTIVE SEED LOCALIZATION;  Surgeon: [name], MD;  Location: [facility];  Service: General;  Laterality: Left;   BREAST LUMPECTOMY WITH RADIOACTIVE SEED LOCALIZATION Left 01/22/2023   Procedure: LEFT BREAST LUMPECTOMY WITH RADIOACTIVE SEED LOCALIZATION X2;  Surgeon: [name], MD;  Location: [facility];  Service: General;  Laterality: Left;   HERNIA REPAIR N/A    WISDOM TOOTH EXTRACTION     Social History   Tobacco Use   Smoking status: Former    Current packs/day: 0.00    Average packs/day: 1 pack/day for 30.0 years (30.0 ttl pk-yrs)    Types: Cigarettes    Start date: 07/05/1980    Quit date: 07/05/2010    Years since quitting: 13.5   Smokeless tobacco: Never  Vaping Use   Vaping status: Never Used  Substance Use Topics   Alcohol use: No   Drug use: No   Family History  Problem  Relation Age of Onset   Diabetes Mother    Liver disease Mother    Cirrhosis Mother    Breast cancer Sister 42   Thyroid disease Sister    Heart failure Sister        rheumatic fever   Rheumatic fever Sister    Allergies  Allergen Reactions   Penicillins Hives   Adhesive [Tape] Rash    band-aid   Enoxaparin Rash    ROS    Objective:    BP 126/86   Pulse 60   Temp 98 F (36.7 C)   Ht 4' 11.75" (1.518 m)   Wt 211 lb 2 oz (95.8 kg)   LMP 02/27/2010   SpO2 99%   BMI 41.58 kg/m  BP Readings from Last 3 Encounters:  01/13/24 126/86  11/24/23 134/80  11/16/23 122/78   Wt Readings from Last 3 Encounters:  01/13/24 211 lb 2 oz (95.8 kg)  12/07/23 210 lb 2 oz (95.3 kg)  11/24/23 211 lb 4.8 oz (95.8 kg)    Physical Exam Vitals and nursing note reviewed.  Constitutional:      General: She is not in acute distress.    Appearance: Normal appearance.  HENT:     Head: Normocephalic.     Right Ear: Tympanic membrane and ear canal normal.     Left Ear: Tympanic membrane and ear canal normal.  Eyes:     Extraocular Movements: Extraocular movements intact.     Pupils: Pupils are equal, round, and reactive to light.  Cardiovascular:     Rate and Rhythm: Normal rate and regular rhythm.     Heart sounds: Normal heart sounds.  Pulmonary:     Effort: Pulmonary effort is normal.     Breath sounds: Normal breath sounds.  Musculoskeletal:     Right lower leg: No edema.     Left lower leg: No edema.  Neurological:     General: No focal deficit present.     Mental Status: She is alert and oriented to person, place, and time. Mental status is at baseline.     Cranial Nerves: No cranial nerve deficit.     Motor: No weakness.     Coordination: Coordination normal.     Gait: Gait normal.     Deep Tendon Reflexes: Reflexes normal.  Psychiatric:        Mood and Affect: Mood normal.        Behavior: Behavior normal.        Thought Content: Thought content normal.         Judgment: Judgment normal.      No results found for any visits on 01/13/24.

## 2024-01-14 ENCOUNTER — Other Ambulatory Visit: Payer: Self-pay | Admitting: Family Medicine

## 2024-01-15 ENCOUNTER — Encounter: Payer: Self-pay | Admitting: Family Medicine

## 2024-01-15 ENCOUNTER — Other Ambulatory Visit: Payer: Self-pay | Admitting: Family Medicine

## 2024-01-15 ENCOUNTER — Other Ambulatory Visit: Payer: Self-pay

## 2024-01-15 NOTE — Telephone Encounter (Signed)
 Duplicate request, last refill 12/21/23.  Requested Prescriptions  Pending Prescriptions Disp Refills   losartan  (COZAAR ) 25 MG tablet [Pharmacy Med Name: LOSARTAN  POTASSIUM 25 MG TAB] 90 tablet 0    Sig: TAKE 1 TABLET (25 MG TOTAL) BY MOUTH DAILY.     Cardiovascular:  Angiotensin Receptor Blockers Failed - 01/15/2024  3:36 PM      Failed - Valid encounter within last 6 months    Recent Outpatient Visits           2 days ago TIA (transient ischemic attack)   [facility] Amadeo June, MD   2 months ago Essential hypertension   [facility] Amadeo June, MD              Passed - Cr in normal range and within 180 days    Creatinine  Date Value Ref Range Status  11/24/2023 0.69 0.44 - 1.00 mg/dL Final         Passed - K in normal range and within 180 days    Potassium  Date Value Ref Range Status  11/24/2023 4.6 3.5 - 5.1 mmol/L Final         Passed - Patient is not pregnant      Passed - Last BP in normal range    BP Readings from Last 1 Encounters:  01/13/24 126/86

## 2024-01-15 NOTE — Telephone Encounter (Signed)
 Too soon for RF, last refill 12/22/23 for 90 days.  Requested Prescriptions  Pending Prescriptions Disp Refills   losartan  (COZAAR ) 25 MG tablet [Pharmacy Med Name: LOSARTAN  POTASSIUM 25 MG TAB] 90 tablet 0    Sig: TAKE 1 TABLET (25 MG TOTAL) BY MOUTH DAILY.     Cardiovascular:  Angiotensin Receptor Blockers Failed - 01/15/2024  9:30 AM      Failed - Valid encounter within last 6 months    Recent Outpatient Visits           2 days ago TIA (transient ischemic attack)   [facility] Amadeo June, MD   2 months ago Essential hypertension    [facility] Amadeo June, MD              Passed - Cr in normal range and within 180 days    Creatinine  Date Value Ref Range Status  11/24/2023 0.69 0.44 - 1.00 mg/dL Final         Passed - K in normal range and within 180 days    Potassium  Date Value Ref Range Status  11/24/2023 4.6 3.5 - 5.1 mmol/L Final         Passed - Patient is not pregnant      Passed - Last BP in normal range    BP Readings from Last 1 Encounters:  01/13/24 126/86

## 2024-02-05 ENCOUNTER — Encounter (HOSPITAL_COMMUNITY): Payer: Self-pay

## 2024-02-12 ENCOUNTER — Ambulatory Visit: Payer: MEDICARE | Admitting: Family Medicine

## 2024-02-16 ENCOUNTER — Encounter: Payer: Self-pay | Admitting: Family Medicine

## 2024-02-26 ENCOUNTER — Encounter: Payer: Self-pay | Admitting: Nurse Practitioner

## 2024-02-29 ENCOUNTER — Encounter: Payer: Self-pay | Admitting: Family Medicine

## 2024-02-29 ENCOUNTER — Ambulatory Visit (INDEPENDENT_AMBULATORY_CARE_PROVIDER_SITE_OTHER): Payer: MEDICARE | Admitting: Family Medicine

## 2024-02-29 VITALS — BP 146/80 | HR 55 | Temp 98.3°F | Ht 59.75 in | Wt 211.5 lb

## 2024-02-29 DIAGNOSIS — D2330 Other benign neoplasm of skin of unspecified part of face: Secondary | ICD-10-CM | POA: Insufficient documentation

## 2024-02-29 DIAGNOSIS — F418 Other specified anxiety disorders: Secondary | ICD-10-CM

## 2024-02-29 DIAGNOSIS — I10 Essential (primary) hypertension: Secondary | ICD-10-CM | POA: Diagnosis not present

## 2024-02-29 DIAGNOSIS — E785 Hyperlipidemia, unspecified: Secondary | ICD-10-CM

## 2024-02-29 DIAGNOSIS — I4891 Unspecified atrial fibrillation: Secondary | ICD-10-CM | POA: Diagnosis not present

## 2024-02-29 DIAGNOSIS — E038 Other specified hypothyroidism: Secondary | ICD-10-CM | POA: Diagnosis not present

## 2024-02-29 DIAGNOSIS — M67449 Ganglion, unspecified hand: Secondary | ICD-10-CM | POA: Insufficient documentation

## 2024-02-29 MED ORDER — LOSARTAN POTASSIUM 50 MG PO TABS: 50.0000 mg | ORAL_TABLET | Freq: Every day | ORAL | 1 refills | Status: DC

## 2024-02-29 NOTE — Progress Notes (Addendum)
 Patient Office Visit  Assessment & Plan:  Essential hypertension -     Losartan  Potassium; Take 1 tablet (50 mg total) by mouth daily.  Dispense: 90 tablet; Refill: 1  Atrial fibrillation, unspecified type (HCC)  Hyperlipidemia, unspecified hyperlipidemia type -     Lipid panel -     Comprehensive metabolic panel with GFR  Other specified hypothyroidism  Situational anxiety -     Ambulatory referral to Psychology   Assessment and Plan    Paroxysmal Atrial Fibrillation Intermittent episodes with lightheadedness and exhaustion. Current medications include metoprolol, diltiazem, flecainide , losartan , and Eliquis. Discussed potential triggers and treatment options. - Continue metoprolol, diltiazem, flecainide , losartan , and Eliquis. - Consider referral for sleep apnea testing. - Discuss potential for Watchman device and ablation with cardiologist.  Stroke Recent TIA or stroke episode. MRI showed patchy area in left frontal lobe, likely small acute ischemic event. Treatment approach unchanged. - Continue blood thinner therapy. - Follow up with neurologist in four months.  Hypertension Home blood pressure readings normal, elevated in office. Current medication is losartan . - Consider taking losartan  in the afternoon or evening. - Monitor blood pressure at home. - Recheck cholesterol levels to assess if LDL is below 70.  Depression Symptoms include lack of energy, decreased enjoyment, and health-related anxiety. Discussed therapy and medication options. - Refer to therapist for psychotherapy. - Consider low-dose medication for depression and anxiety.      Increase the losartan  to 50 mg once a day.  Continue other medications.  Continue blood pressure monitoring at home.  Return in 2 months or sooner if necessary.  Recommend healthy low-sodium diet and consistent exercise working up to 30 minutes 5 days a week. Patient needs to discuss other treatment options with her  cardiologist in [place]. Will recheck labs today and adjust meds if necessary.  Return in about 2 months (around 04/30/2024), or if symptoms worsen or fail to improve, for hypertension.   Subjective:     Patient ID: Theresa Duncan, female    DOB: [birth year]  Age: 68 y.o. MRN: [id number]  Chief Complaint  Patient presents with   Medical Management of Chronic Issues    HPI Discussed the use of AI scribe software for clinical note transcription with the patient, who gave verbal consent to proceed.  History of Present Illness         History of Present Illness Theresa Duncan is a 68 year old female with atrial fibrillation and a history of stroke who presents for follow-up regarding her medication management and recent episodes of AFib.  She has experienced three episodes of atrial fibrillation since May 21st, with the most recent episodes lasting six to seven hours. During these episodes, her heart rate fluctuates between 99 to 125 beats per minute, and she feels lightheaded and almost as if she might pass out. She describes feeling exhausted after these episodes, likening it to 'running a marathon'.  She is currently taking metoprolol, diltiazem, flecainide , losartan , and Eliquis. She takes 25 mg of metoprolol once daily, which she believes may be extended release, and plans to confirm this at home. She also takes 100 mg of flecainide  in the morning and at night. She sometimes experiences a 'sinking feeling' or lightheadedness, which she attributes to metoprolol.  She has a history of stroke, with a recent event involving the left frontal lobe, initially described as a TIA. She is on blood thinners to reduce stroke risk and keeps a journal of her blood pressure and  heart rate. Her blood pressure at home is generally within normal range, but it was elevated during a recent visit.  She has concerns about her mental health, noting increased anxiety and depressive symptoms, such as not participating in  activities she used to enjoy and feeling 'depressed'. She is considering therapy to address these issues. Hyperlipidemia-denies unusual muscle aches or muscle cramps or difficulty tolerating statin therapy.  Aware of need for diet control, exercise and healthy eating.  Patient is aware not to consume grapefruit juice or pomegranate juice. Patient has been taking Crestor  10mg  once per day.   She reports sleeping well and does not experience frequent awakenings during the night. She is cautious about her salt intake, although she occasionally consumes high-sodium foods like corn chips and V8 juice.  Results RADIOLOGY MRI brain: Patchy area in left frontal lobe, suspicious for small acute ischemic event Ultrasound heart: Normal Ultrasound carotid arteries: Normal  Assessment and Plan Paroxysmal Atrial Fibrillation Intermittent episodes with lightheadedness and exhaustion. Current medications include metoprolol, diltiazem, flecainide , losartan , and Eliquis. Discussed potential triggers and treatment options. - Continue metoprolol, diltiazem, flecainide , losartan , and Eliquis. - Consider referral for sleep apnea testing. - Discuss potential for Watchman device and ablation with cardiologist.  Stroke Recent TIA or stroke episode. MRI showed patchy area in left frontal lobe, likely small acute ischemic event. Treatment approach unchanged. Neurologist discussed with patient that she had a stroke and not TIA - Continue blood thinner therapy. - Follow up with neurologist in four months.  Hypertension Home blood pressure readings normal, elevated in office. Current medication is losartan . - Consider taking losartan  in the afternoon or evening. - Monitor blood pressure at home. -Hyperlipidemia- Recheck cholesterol levels to assess if LDL is below 70.  Depression/anxiety- Symptoms include lack of energy, decreased enjoyment, and health-related anxiety. Discussed therapy and medication options.  Patient declines medication at this time - Refer to therapist for psychotherapy. - Consider low-dose medication for depression and anxiety.    The ASCVD Risk score (Arnett DK, et al., 2019) failed to calculate for the following reasons:   Risk score cannot be calculated because patient has a medical history suggesting prior/existing ASCVD  Past Medical History:  Diagnosis Date   Anxiety    chronic, panic attacks   Atrial fibrillation (HCC) 06/02/2010   treated with Cardiazem and Lovenox at [facility], now ASA   Cancer (HCC)    breast   Colon polyp    tubular adenoma   Hypertension    Hypothyroidism    Migraine    Personal history of radiation therapy    Tachycardia 05/2010   Thyroid disease 05/2010   hypothyroid   Past Surgical History:  Procedure Laterality Date   APPENDECTOMY  1967   BREAST BIOPSY     BREAST BIOPSY Right 11/28/2022   MM RT BREAST BX W LOC DEV 1ST LESION IMAGE BX SPEC STEREO GUIDE 11/28/2022 [facility]   BREAST BIOPSY  01/20/2023   MM RT RADIOACTIVE SEED LOC MAMMO GUIDE 01/20/2023 [facility]   BREAST BIOPSY  01/20/2023   MM RT RADIOACTIVE SEED EA ADD LESION LOC MAMMO GUIDE 01/20/2023 [facility]   BREAST BIOPSY  01/20/2023   MM LT RADIOACTIVE SEED LOC MAMMO GUIDE 01/20/2023 [facility]   BREAST BIOPSY  01/20/2023   MM LT RADIOACTIVE SEED EA ADD LESION LOC MAMMO GUIDE 01/20/2023 [facility]   BREAST LUMPECTOMY Left 10/2017   BREAST LUMPECTOMY Bilateral 01/22/2023   Right-ILC, Left-DCIS, ALH   BREAST LUMPECTOMY WITH  RADIOACTIVE SEED AND SENTINEL LYMPH NODE BIOPSY Right 01/22/2023   Procedure: RIGHT BREAST LUMPECTOMY WITH RADIOACTIVE SEED X2 AND SENTINEL LYMPH NODE BIOPSY;  Surgeon: Lockie Rima, MD;  Location: [facility];  Service: General;  Laterality: Right;   BREAST LUMPECTOMY WITH RADIOACTIVE SEED LOCALIZATION Left 11/18/2017   Procedure: LEFT BREAST LUMPECTOMY WITH RADIOACTIVE SEED LOCALIZATION;  Surgeon: Lockie Rima,  MD;  Location: [facility];  Service: General;  Laterality: Left;   BREAST LUMPECTOMY WITH RADIOACTIVE SEED LOCALIZATION Left 01/22/2023   Procedure: LEFT BREAST LUMPECTOMY WITH RADIOACTIVE SEED LOCALIZATION X2;  Surgeon: Lockie Rima, MD;  Location: [facility];  Service: General;  Laterality: Left;   HERNIA REPAIR N/A    WISDOM TOOTH EXTRACTION     Social History   Tobacco Use   Smoking status: Former    Current packs/day: 0.00    Average packs/day: 1 pack/day for 30.0 years (30.0 ttl pk-yrs)    Types: Cigarettes    Start date: 07/05/1980    Quit date: 07/05/2010    Years since quitting: 13.6   Smokeless tobacco: Never  Vaping Use   Vaping status: Never Used  Substance Use Topics   Alcohol use: No   Drug use: No   Family History  Problem Relation Age of Onset   Diabetes Mother    Liver disease Mother    Cirrhosis Mother    Breast cancer Sister 57   Thyroid disease Sister    Heart failure Sister        rheumatic fever   Rheumatic fever Sister    Allergies  Allergen Reactions   Penicillins Hives   Adhesive [Tape] Rash    band-aid   Enoxaparin Rash    ROS    Objective:    BP (!) 146/80   Pulse (!) 55   Temp 98.3 F (36.8 C)   Ht 4' 11.75" (1.518 m)   Wt 211 lb 8 oz (95.9 kg)   LMP 02/27/2010   SpO2 99%   BMI 41.65 kg/m  BP Readings from Last 3 Encounters:  02/29/24 (!) 146/80  01/13/24 126/86  11/24/23 134/80   Wt Readings from Last 3 Encounters:  02/29/24 211 lb 8 oz (95.9 kg)  01/13/24 211 lb 2 oz (95.8 kg)  12/07/23 210 lb 2 oz (95.3 kg)    Physical Exam Vitals and nursing note reviewed.  Constitutional:      Appearance: Normal appearance.  HENT:     Head: Normocephalic.     Right Ear: Tympanic membrane, ear canal and external ear normal.     Left Ear: Tympanic membrane, ear canal and external ear normal.  Eyes:     Extraocular Movements: Extraocular movements intact.     Conjunctiva/sclera: Conjunctivae normal.     Pupils:  Pupils are equal, round, and reactive to light.  Cardiovascular:     Rate and Rhythm: Normal rate and regular rhythm.     Heart sounds: Normal heart sounds.  Pulmonary:     Effort: Pulmonary effort is normal.     Breath sounds: Normal breath sounds.  Musculoskeletal:     Right lower leg: No edema.     Left lower leg: No edema.  Neurological:     General: No focal deficit present.     Mental Status: She is alert and oriented to person, place, and time.  Psychiatric:        Mood and Affect: Mood normal.        Behavior: Behavior normal.  Thought Content: Thought content normal.        Judgment: Judgment normal.      Results for orders placed or performed in visit on 02/29/24  Lipid panel  Result Value Ref Range   Cholesterol 181 <200 mg/dL   HDL 73 > OR = 50 mg/dL   Triglycerides 87 <161 mg/dL   LDL Cholesterol (Calc) 90 mg/dL (calc)   Total CHOL/HDL Ratio 2.5 <5.0 (calc)   Non-HDL Cholesterol (Calc) 108 <130 mg/dL (calc)  Comprehensive metabolic panel with GFR  Result Value Ref Range   Glucose, Bld 90 65 - 99 mg/dL   BUN 14 7 - 25 mg/dL   Creat 0.96 0.45 - 4.09 mg/dL   eGFR 95 > OR = 60 WJ/XBJ/4.78G9   BUN/Creatinine Ratio SEE NOTE: 6 - 22 (calc)   Sodium 140 135 - 146 mmol/L   Potassium 4.1 3.5 - 5.3 mmol/L   Chloride 102 98 - 110 mmol/L   CO2 26 20 - 32 mmol/L   Calcium  9.7 8.6 - 10.4 mg/dL   Total Protein 7.8 6.1 - 8.1 g/dL   Albumin 4.7 3.6 - 5.1 g/dL   Globulin 3.1 1.9 - 3.7 g/dL (calc)   AG Ratio 1.5 1.0 - 2.5 (calc)   Total Bilirubin 0.4 0.2 - 1.2 mg/dL   Alkaline phosphatase (APISO) 65 37 - 153 U/L   AST 14 10 - 35 U/L   ALT 14 6 - 29 U/L

## 2024-03-01 ENCOUNTER — Ambulatory Visit: Payer: Self-pay | Admitting: Family Medicine

## 2024-03-01 LAB — COMPREHENSIVE METABOLIC PANEL WITH GFR
AG Ratio: 1.5 (calc) (ref 1.0–2.5)
ALT: 14 U/L (ref 6–29)
AST: 14 U/L (ref 10–35)
Albumin: 4.7 g/dL (ref 3.6–5.1)
Alkaline phosphatase (APISO): 65 U/L (ref 37–153)
BUN: 14 mg/dL (ref 7–25)
CO2: 26 mmol/L (ref 20–32)
Calcium: 9.7 mg/dL (ref 8.6–10.4)
Chloride: 102 mmol/L (ref 98–110)
Creat: 0.67 mg/dL (ref 0.50–1.05)
Globulin: 3.1 g/dL (ref 1.9–3.7)
Glucose, Bld: 90 mg/dL (ref 65–99)
Potassium: 4.1 mmol/L (ref 3.5–5.3)
Sodium: 140 mmol/L (ref 135–146)
Total Bilirubin: 0.4 mg/dL (ref 0.2–1.2)
Total Protein: 7.8 g/dL (ref 6.1–8.1)
eGFR: 95 mL/min/{1.73_m2} (ref >=60)

## 2024-03-01 LAB — LIPID PANEL
Cholesterol: 181 mg/dL (ref <200)
HDL: 73 mg/dL (ref >=50)
LDL Cholesterol (Calc): 90 mg/dL
Non-HDL Cholesterol (Calc): 108 mg/dL (ref <130)
Total CHOL/HDL Ratio: 2.5 (calc) (ref <5.0)
Triglycerides: 87 mg/dL (ref <150)

## 2024-03-06 ENCOUNTER — Encounter: Payer: Self-pay | Admitting: Family Medicine

## 2024-03-07 ENCOUNTER — Other Ambulatory Visit: Payer: Self-pay

## 2024-03-07 ENCOUNTER — Ambulatory Visit: Payer: MEDICARE | Attending: General Surgery

## 2024-03-07 VITALS — Wt 212.5 lb

## 2024-03-07 DIAGNOSIS — Z483 Aftercare following surgery for neoplasm: Secondary | ICD-10-CM | POA: Insufficient documentation

## 2024-03-07 MED ORDER — ZEPBOUND 2.5 MG/0.5ML ~~LOC~~ SOAJ: 2.5000 mg | SUBCUTANEOUS | 1 refills | Status: DC

## 2024-03-07 NOTE — Therapy (Signed)
 OUTPATIENT PHYSICAL THERAPY SOZO SCREENING NOTE   Patient Name: Theresa Duncan MRN: [id number] DOB:Apr 25, 1956, 68 y.o., female Today's Date: 03/07/2024  PCP: Amadeo June, MD REFERRING PROVIDER: Lockie Rima, MD   PT End of Session - 03/07/24 1051     Visit Number 3   # unchanged due to screen only   PT Start Time 1049    PT Stop Time 1053    PT Time Calculation (min) 4 min    Activity Tolerance Patient tolerated treatment well    Behavior During Therapy WFL for tasks assessed/performed             Past Medical History:  Diagnosis Date   Anxiety    chronic, panic attacks   Atrial fibrillation (HCC) 06/02/2010   treated with Cardiazem and Lovenox at [facility], now ASA   Cancer [facility])    breast   Colon polyp    tubular adenoma   Hypertension    Hypothyroidism    Migraine    Personal history of radiation therapy    Tachycardia 05/2010   Thyroid disease 05/2010   hypothyroid   Past Surgical History:  Procedure Laterality Date   APPENDECTOMY  1967   BREAST BIOPSY     BREAST BIOPSY Right 11/28/2022   MM RT BREAST BX W LOC DEV 1ST LESION IMAGE BX SPEC STEREO GUIDE 11/28/2022 [facility]   BREAST BIOPSY  01/20/2023   MM RT RADIOACTIVE SEED LOC MAMMO GUIDE 01/20/2023 [facility]   BREAST BIOPSY  01/20/2023   MM RT RADIOACTIVE SEED EA ADD LESION LOC MAMMO GUIDE 01/20/2023 [facility]   BREAST BIOPSY  01/20/2023   MM LT RADIOACTIVE SEED LOC MAMMO GUIDE 01/20/2023 [facility]   BREAST BIOPSY  01/20/2023   MM LT RADIOACTIVE SEED EA ADD LESION LOC MAMMO GUIDE 01/20/2023 [facility]   BREAST LUMPECTOMY Left 10/2017   BREAST LUMPECTOMY Bilateral 01/22/2023   Right-ILC, Left-DCIS, ALH   BREAST LUMPECTOMY WITH RADIOACTIVE SEED AND SENTINEL LYMPH NODE BIOPSY Right 01/22/2023   Procedure: RIGHT BREAST LUMPECTOMY WITH RADIOACTIVE SEED X2 AND SENTINEL LYMPH NODE BIOPSY;  Surgeon: Lockie Rima, MD;  Location: [facility];  Service: General;   Laterality: Right;   BREAST LUMPECTOMY WITH RADIOACTIVE SEED LOCALIZATION Left 11/18/2017   Procedure: LEFT BREAST LUMPECTOMY WITH RADIOACTIVE SEED LOCALIZATION;  Surgeon: Lockie Rima, MD;  Location: [facility];  Service: General;  Laterality: Left;   BREAST LUMPECTOMY WITH RADIOACTIVE SEED LOCALIZATION Left 01/22/2023   Procedure: LEFT BREAST LUMPECTOMY WITH RADIOACTIVE SEED LOCALIZATION X2;  Surgeon: Lockie Rima, MD;  Location: [facility];  Service: General;  Laterality: Left;   HERNIA REPAIR N/A    WISDOM TOOTH EXTRACTION     Patient Active Problem List   Diagnosis Date Noted   Epidermal nevus of face 02/29/2024   Ganglion of hand 02/29/2024   Blue nevus of nose 01/13/2024   Cherry angioma 01/13/2024   Lentigo 01/13/2024   Mucous cyst of digit of right hand 01/13/2024   Neoplasm of uncertain behavior of skin 01/13/2024   Nevus of face 01/13/2024   BMI 40.0-44.9, adult (HCC) 01/13/2024   TIA (transient ischemic attack) 01/13/2024   Greater trochanteric bursitis of right hip 09/21/2023   Gallstone pancreatitis 07/03/2023   Malignant neoplasm of lower-inner quadrant of right breast of female, estrogen receptor positive (HCC) 12/12/2022   Adenomatous polyp of colon 12/08/2022   Claustrophobia 12/08/2022   S/P hernia repair 05/20/2021   Small bowel obstruction (HCC) 05/19/2021   Atrial flutter (  HCC) 03/08/2021   Lobular carcinoma in situ (LCIS) of left breast 12/18/2017   Situational anxiety 10/15/2017   Palpitations 10/01/2016   Hair loss 07/15/2016   Seborrheic keratoses 07/15/2016   Hyperlipidemia 12/20/2014   Atrial fibrillation (HCC) 05/16/2014   Essential hypertension 05/16/2014   PVCs (premature ventricular contractions) 05/16/2014   Chronic anxiety 07/05/2013   Other specified hypothyroidism 07/05/2013   Morbid obesity (HCC) 07/05/2013   Vitamin D deficiency 07/05/2013    REFERRING DIAG: left breast cancer at risk for lymphedema  THERAPY DIAG:   Aftercare following surgery for neoplasm  PERTINENT HISTORY: Hx of Lt LCIS in 2018 with lumpectomy and no nodes removed. Now 2 spots on the Rt and 2 spots on the left. Underwent Lt lumpectomy 01/22/23, x 2 Rt lumpectomy x 2 and Rt SLNB with 2 negative nodes removed. Only the Rt side was cancer. Will do radiation bilaterally x 3 weeks.   PRECAUTIONS: right UE Lymphedema risk  SUBJECTIVE: Pt returns for her 3 month L-Dex screen.   PAIN:  Are you having pain? No  SOZO SCREENING: Patient was assessed today using the SOZO machine to determine the lymphedema index score. This was compared to her baseline score. It was determined that she is within the recommended range when compared to her baseline and no further action is needed at this time. She will continue SOZO screenings. These are done every 3 months for 2 years post operatively followed by every 6 months for 2 years, and then annually.   L-DEX FLOWSHEETS - 03/07/24 1000       L-DEX LYMPHEDEMA SCREENING   Measurement Type Unilateral    L-DEX MEASUREMENT EXTREMITY Upper Extremity    POSITION  Standing    DOMINANT SIDE Right    At Risk Side Right    BASELINE SCORE (UNILATERAL) -4.2    L-DEX SCORE (UNILATERAL) -3.4    VALUE CHANGE (UNILAT) 0.8             Roslynn Coombes, PTA 03/07/24 10:54 AM

## 2024-03-14 ENCOUNTER — Encounter: Payer: Self-pay | Admitting: Family Medicine

## 2024-03-14 ENCOUNTER — Other Ambulatory Visit: Payer: Self-pay

## 2024-03-14 DIAGNOSIS — L821 Other seborrheic keratosis: Secondary | ICD-10-CM

## 2024-04-11 ENCOUNTER — Encounter: Payer: Self-pay | Admitting: Family Medicine

## 2024-04-11 ENCOUNTER — Other Ambulatory Visit: Payer: Self-pay

## 2024-04-11 MED ORDER — LEVOTHYROXINE SODIUM 112 MCG PO TABS: 112.0000 ug | ORAL_TABLET | Freq: Every day | ORAL | 1 refills | Status: DC

## 2024-04-12 ENCOUNTER — Encounter: Payer: Self-pay | Admitting: Family Medicine

## 2024-04-12 ENCOUNTER — Other Ambulatory Visit: Payer: Self-pay | Admitting: Family Medicine

## 2024-04-14 ENCOUNTER — Encounter: Payer: Self-pay | Admitting: Family Medicine

## 2024-04-14 ENCOUNTER — Other Ambulatory Visit (HOSPITAL_COMMUNITY): Payer: Self-pay

## 2024-04-14 NOTE — Telephone Encounter (Signed)
 Good afternoon, I need a daignosis please.  Thanks!

## 2024-04-14 NOTE — Telephone Encounter (Signed)
?

## 2024-04-15 ENCOUNTER — Other Ambulatory Visit (HOSPITAL_COMMUNITY): Payer: Self-pay

## 2024-04-15 ENCOUNTER — Telehealth: Payer: Self-pay | Admitting: Pharmacy Technician

## 2024-04-15 NOTE — Telephone Encounter (Signed)
 Pharmacy Patient Advocate Encounter   Received notification from Pt Calls Messages that prior authorization for Zepbound  2.5MG /0.5ML pen-injectors is required/requested.   Insurance verification completed.   The patient is insured through [place] .   Per test claim: PA required; PA submitted to above mentioned insurance via CoverMyMeds Key/confirmation #/EOC [facility] Status is pending

## 2024-04-15 NOTE — Telephone Encounter (Signed)
 Thank you, submitted and pending

## 2024-04-15 NOTE — Telephone Encounter (Signed)
 Pharmacy Patient Advocate Encounter  Received notification from [employer] that Prior Authorization for Zepbound  2.5MG /0.5ML pen-injectors has been DENIED.  Full denial letter will be uploaded to the media tab. See denial reason below.     PA #/Case ID/Reference #: [id number]

## 2024-04-21 ENCOUNTER — Ambulatory Visit (INDEPENDENT_AMBULATORY_CARE_PROVIDER_SITE_OTHER): Payer: MEDICARE | Admitting: Licensed Clinical Social Worker

## 2024-04-21 DIAGNOSIS — F4322 Adjustment disorder with anxiety: Secondary | ICD-10-CM

## 2024-04-22 NOTE — Progress Notes (Signed)
 Comprehensive Clinical Assessment (CCA) Note  04/22/2024 Theresa Duncan [id number]  Chief Complaint:  Chief Complaint  Patient presents with   Adjustment Disorder   Visit Diagnosis: Adjustment disorder with anxious mood     CCA Biopsychosocial Intake/Chief Complaint:  Situational anxiety due to health  Current Symptoms/Problems: worries about having an afib episode, increased heart rate at times, isolates due to worries, lots of what if thinking,  caretaker and doesn't feel like she has much of a life, feels overwhelmed at times, not a lot of motivation, low energy, feels stuck, can feel down, can feel angry, sleeps better in the recliner, doesn't like enclosed places,    no SI/HI, no psychosis,   Patient Reported Schizophrenia/Schizoaffective Diagnosis in Past: No   Strengths: organize well, social, has friends,  Preferences: doesn't prefer being stuck, doesn't prefer being in enclosed places  Abilities: cooking, golfing   Type of Services Patient Feels are Needed: Therapy   Initial Clinical Notes/Concerns: Symptoms started around age 68 when her husband had a stroke,   symptoms occur a few days a week, symptoms are mild to moderate,   Mental Health Symptoms Depression:  Tearfulness   Duration of Depressive symptoms: Greater than two weeks   Mania:  None   Anxiety:   Worrying; Tension; Sleep; Irritability   Psychosis:  None   Duration of Psychotic symptoms: No data recorded  Trauma:  None   Obsessions:  None   Compulsions:  None   Inattention:  None   Hyperactivity/Impulsivity:  None   Oppositional/Defiant Behaviors:  None   Emotional Irregularity:  None   Other Mood/Personality Symptoms:  None    Mental Status Exam Appearance and self-care  Stature:  Average   Weight:  Overweight   Clothing:  Casual   Grooming:  Normal   Cosmetic use:  Age appropriate   Posture/gait:  Normal   Motor activity:  Not Remarkable   Sensorium  Attention:   Normal   Concentration:  Normal   Orientation:  Object; Person; Place; Situation   Recall/memory:  Normal   Affect and Mood  Affect:  Appropriate   Mood:  Euthymic   Relating  Eye contact:  Normal   Facial expression:  Responsive   Attitude toward examiner:  Cooperative   Thought and Language  Speech flow: Flight of Ideas   Thought content:  Appropriate to Mood and Circumstances   Preoccupation:  None   Hallucinations:  None   Organization:  No data recorded  Company secretary of Knowledge:  Good   Intelligence:  Average   Abstraction:  Normal   Judgement:  Good   Reality Testing:  Realistic   Insight:  Good   Decision Making:  Paralyzed   Social Functioning  Social Maturity:  Responsible   Social Judgement:  Normal   Stress  Stressors:  Illness; Relationship   Coping Ability:  Human resources officer Deficits:  None   Supports:  Family     Religion: Religion/Spirituality Are You A Religious Person?: Yes What is Your Religious Affiliation?: Baptist How Might This Affect Treatment?: Support in treatment  Leisure/Recreation: Leisure / Recreation Do You Have Hobbies?: Yes Leisure and Hobbies: Golf, dinner club, travel  Exercise/Diet: Exercise/Diet Do You Exercise?: No Have You Gained or Lost A Significant Amount of Weight in the Past Six Months?: No Do You Follow a Special Diet?: No Do You Have Any Trouble Sleeping?: No   CCA Employment/Education Employment/Work Situation: Employment / Work Academic librarian Situation: Retired  Patient's Job has Been Impacted by Current Illness: No What is the Longest Time Patient has Held a Job?: 17 years, 15 years Where was the Patient Employed at that Time?: [employer], [employer] Has Patient ever Been in the [employer]?: No  Education: Education Is Patient Currently Attending School?: No Last Grade Completed: 12 Name of High School: [employer] Did Garment/textile technologist  From [employer]?: Yes Did Theme park manager?: No (Some college) Did Designer, television/film set?: No Did You Have Any Special Interests In School?: Marine scientist, Business Did You Have An Individualized Education Program (IIEP): No Did You Have Any Difficulty At [employer]?: No Patient's Education Has Been Impacted by Current Illness: No   CCA Family/Childhood History Family and Relationship History: Family history Marital status: Married Number of Years Married: 38 What types of issues is patient dealing with in the relationship?: Camera operator for husband Additional relationship information: None Are you sexually active?: No What is your sexual orientation?: Heterosexual Has your sexual activity been affected by drugs, alcohol, medication, or emotional stress?: emotional stress Does patient have children?: Yes How many children?: 1 How is patient's relationship with their children?: Stepdaughter: good  Childhood History:  Childhood History By whom was/is the patient raised?: Mother Additional childhood history information: Mother raised her. Parents seperated when she was 3. Father was occasionally in her life. Patient describes childhood as normal. Description of patient's relationship with caregiver when they were a child: Mother:  good  Father: limited Patient's description of current relationship with people who raised him/her: Mohter: deceased, Father: deceased How were you disciplined when you got in trouble as a child/adolescent?: not really disciplined- rarely was switched Does patient have siblings?: Yes Number of Siblings: 3 Description of patient's current relationship with siblings: Sisters: one deceased, good relationship with others Did patient suffer any verbal/emotional/physical/sexual abuse as a child?: No Did patient suffer from severe childhood neglect?: No Has patient ever been sexually abused/assaulted/raped as an adolescent or adult?: No Was the patient  ever a victim of a crime or a disaster?: No Witnessed domestic violence?: No Has patient been affected by domestic violence as an adult?: No  Child/Adolescent Assessment:     CCA Substance Use Alcohol/Drug Use: Alcohol / Drug Use Pain Medications: See patient MAR Prescriptions: See patinet MAR Over the Counter: See patient MAR History of alcohol / drug use?: No history of alcohol / drug abuse                         ASAM's:  Six Dimensions of Multidimensional Assessment  Dimension 1:  Acute Intoxication and/or Withdrawal Potential:   Dimension 1:  Description of individual's past and current experiences of substance use and withdrawal: None  Dimension 2:  Biomedical Conditions and Complications:   Dimension 2:  Description of patient's biomedical conditions and  complications: None  Dimension 3:  Emotional, Behavioral, or Cognitive Conditions and Complications:  Dimension 3:  Description of emotional, behavioral, or cognitive conditions and complications: None  Dimension 4:  Readiness to Change:  Dimension 4:  Description of Readiness to Change criteria: None  Dimension 5:  Relapse, Continued use, or Continued Problem Potential:  Dimension 5:  Relapse, continued use, or continued problem potential critiera description: None  Dimension 6:  Recovery/Living Environment:  Dimension 6:  Recovery/Iiving environment criteria description: None  ASAM Severity Score: ASAM's Severity Rating Score: 0  ASAM Recommended Level of Treatment:     Substance use Disorder (SUD)  Recommendations for Services/Supports/Treatments: Recommendations for Services/Supports/Treatments Recommendations For Services/Supports/Treatments: Individual Therapy  DSM5 Diagnoses: Patient Active Problem List   Diagnosis Date Noted   Epidermal nevus of face 02/29/2024   Ganglion of hand 02/29/2024   Blue nevus of nose 01/13/2024   Cherry angioma 01/13/2024   Lentigo 01/13/2024   Mucous cyst of digit  of right hand 01/13/2024   Neoplasm of uncertain behavior of skin 01/13/2024   Nevus of face 01/13/2024   BMI 40.0-44.9, adult (HCC) 01/13/2024   TIA (transient ischemic attack) 01/13/2024   Greater trochanteric bursitis of right hip 09/21/2023   Gallstone pancreatitis 07/03/2023   Malignant neoplasm of lower-inner quadrant of right breast of female, estrogen receptor positive (HCC) 12/12/2022   Adenomatous polyp of colon 12/08/2022   Claustrophobia 12/08/2022   S/P hernia repair 05/20/2021   Small bowel obstruction (HCC) 05/19/2021   Atrial flutter (HCC) 03/08/2021   Lobular carcinoma in situ (LCIS) of left breast 12/18/2017   Situational anxiety 10/15/2017   Palpitations 10/01/2016   Hair loss 07/15/2016   Seborrheic keratoses 07/15/2016   Hyperlipidemia 12/20/2014   Atrial fibrillation (HCC) 05/16/2014   Essential hypertension 05/16/2014   PVCs (premature ventricular contractions) 05/16/2014   Chronic anxiety 07/05/2013   Other specified hypothyroidism 07/05/2013   Morbid obesity (HCC) 07/05/2013   Vitamin D deficiency 07/05/2013   Case Summary   Identifying information: Theresa Duncan is a 68 y.o. Caucasian female. She lives in [place], [place] with his husband Ron who had a stroke in 2022. She is retired and has an adult step daughter.   Chief Complaint: Situational anxiety  History of present illness: Symptoms started after her husband had a stroke and she had health issues at the same time  Emotional Symptoms: anger, feels down, overwhelmed, worries about Afib or her husband  Cognitive Symptoms: Doesn't like enclosed places, what if's  Behavioral Symptoms: isolates/home body  Physiological Symptoms: increased heart rate, low energy,   Stressors: Being a caretaker  Psychiatric history: None  Personal and Social history: She retired in 2022. Husband had a stroke in July of 2022 and he was air lifted to [facility]. He had another small stroke while in the hospital. And  then his incision was bleeding but they were ble to stop it but a new was damaged in his leg. He has difficulty with walking. During this time she got sick as well. When her husband fell, she ended up with a hernia that needed surgery. She was in the hospital for a week and when she got out her husband got sick then went back to [facility] then transferred to a rehab center while she healed. They were both going to appointments about 4 days a week after this. She was diagnosed with Stage 1 breast cancer in Dec 2023 and it was caught early and had surgery in April 2024. Had a small stroke in April 2025. Diagnosed with Afib in 2011 and currently on blood thinners.   Medical History: Breast cancer and Afib  Mental Status check: Patient was oriented x4 (person, place, situation, and time). Patient was casually dressed, and appropriately groomed. Patient was alert, engaged, pleasant, and cooperative.   ICD-10 or DSM-5 diagnosis: Adjustment disorder with anxious mood   Percipients: Husband's stroke.   Strengths: organize well, social, has friends,    Patient Centered Plan: Patient is on the following Treatment Plan(s):  Treatment plan will be completed next session. Patient will identify barriers to treatment between session.    Referrals to Alternative  Service(s): Referred to Alternative Service(s):   Place:   Date:   Time:    Referred to Alternative Service(s):   Place:   Date:   Time:    Referred to Alternative Service(s):   Place:   Date:   Time:    Referred to Alternative Service(s):   Place:   Date:   Time:      Collaboration of Care: Primary Care Provider AEB Dr. Connie Emperor, MD  Patient/Guardian was advised Release of Information must be obtained prior to any record release in order to collaborate their care with an outside provider. Patient/Guardian was advised if they have not already done so to contact the registration department to sign all necessary forms in order for us  to release  information regarding their care.   Consent: Patient/Guardian gives verbal consent for treatment and assignment of benefits for services provided during this visit. Patient/Guardian expressed understanding and agreed to proceed.   Fonda Conroy, LCSW

## 2024-05-02 ENCOUNTER — Encounter: Payer: Self-pay | Admitting: Family Medicine

## 2024-05-02 ENCOUNTER — Ambulatory Visit: Payer: MEDICARE | Admitting: Family Medicine

## 2024-05-02 VITALS — BP 126/84 | HR 62 | Temp 98.5°F | Ht 59.75 in | Wt 213.4 lb

## 2024-05-02 DIAGNOSIS — F418 Other specified anxiety disorders: Secondary | ICD-10-CM

## 2024-05-02 DIAGNOSIS — E038 Other specified hypothyroidism: Secondary | ICD-10-CM

## 2024-05-02 DIAGNOSIS — I639 Cerebral infarction, unspecified: Secondary | ICD-10-CM

## 2024-05-02 DIAGNOSIS — I6930 Unspecified sequelae of cerebral infarction: Secondary | ICD-10-CM

## 2024-05-02 DIAGNOSIS — F4322 Adjustment disorder with anxiety: Secondary | ICD-10-CM

## 2024-05-02 DIAGNOSIS — E785 Hyperlipidemia, unspecified: Secondary | ICD-10-CM | POA: Diagnosis not present

## 2024-05-02 DIAGNOSIS — Z6841 Body Mass Index (BMI) 40.0 and over, adult: Secondary | ICD-10-CM | POA: Diagnosis not present

## 2024-05-02 DIAGNOSIS — I10 Essential (primary) hypertension: Secondary | ICD-10-CM

## 2024-05-02 DIAGNOSIS — I4891 Unspecified atrial fibrillation: Secondary | ICD-10-CM

## 2024-05-02 MED ORDER — WEGOVY 0.25 MG/0.5ML ~~LOC~~ SOAJ: 0.2500 mg | Freq: Two times a day (BID) | SUBCUTANEOUS | 1 refills | Status: DC

## 2024-05-02 MED ORDER — ALPRAZOLAM 0.25 MG PO TABS: 0.2500 mg | ORAL_TABLET | Freq: Every day | ORAL | 1 refills | Status: AC | PRN

## 2024-05-02 MED ORDER — ROSUVASTATIN CALCIUM 10 MG PO TABS: 10.0000 mg | ORAL_TABLET | Freq: Every day | ORAL | 1 refills | Status: DC

## 2024-05-02 NOTE — Progress Notes (Signed)
 Patient Office Visit  Assessment & Plan:  Adjustment disorder with anxious mood  Hyperlipidemia, unspecified hyperlipidemia type -     Rosuvastatin  Calcium ; Take 1 tablet (10 mg total) by mouth daily.  Dispense: 90 tablet; Refill: 1  BMI 40.0-44.9, adult (HCC) -     Wegovy ; Inject 0.25 mg into the skin in the morning and at bedtime.  Dispense: 2 mL; Refill: 1  Essential hypertension  Other specified hypothyroidism  Cerebrovascular accident (CVA), unspecified mechanism (HCC) -     Ambulatory referral to Sleep Studies  Atrial fibrillation, unspecified type (HCC) -     Ambulatory referral to Sleep Studies  Situational anxiety -     ALPRAZolam ; Take 1 tablet (0.25 mg total) by mouth daily as needed for anxiety.  Dispense: 30 tablet; Refill: 1   Assessment and Plan    Atrial fibrillation Intermittent episodes with tolerable heart rate. No medication changes due to low resting heart rate concerns. On Eliquis for anticoagulation. Ablation considered if episodes increase or become symptomatic. Home sleep study ordered for potential sleep apnea. patient cannot stay over night at hospital due to caretaking responsibilies - Continue Eliquis for anticoagulation. - Wear heart monitor for one month before next cardiology appointment. - Consider ablation if episodes increase or become symptomatic. - Order home sleep study for potential sleep apnea.  Premature ventricular contractions Occasional PVCs noted, benign and not indicative of atrial fibrillation. - Reassured about benign nature of PVCs.  Adjustment disorder with anxiety Related to life changes and caregiving. Monthly psychotherapy scheduled. Uses Xanax  as needed.patient declines starting daily medication for anxiety - Continue monthly psychotherapy. - Renew Xanax  prescription as needed.  Obesity Discussed management with Wegovy  for weight loss. Aware of side effects and willing to start low dose. Exploring Mediterranean  diet. Insurance coverage uncertain. - Prescribe Wegovy  at 0.25 mg. - Monitor for side effects, especially nausea and future episodes of pancreatitis. - Encourage Mediterranean diet adherence. - Encourage Silver Sneakers participation.  Hyperlipidemia Recent cholesterol levels satisfactory. - Renew rosuvastatin  prescription.  Insomnia Sleep quality evaluated in context of potential sleep apnea. - Order home sleep study for potential sleep apnea.     Encouraged her to start going to the local gym since she has [employer] [place] [employer] results were reviewed and analyzed as part of the medical decision making of this visit. Reviewed psychotherapy note and cardiology notes during office visit.  Return in about 3 months (around 08/02/2024), or if symptoms worsen or fail to improve.   Subjective:    Patient ID: Theresa Duncan Barefoot, female    DOB: 08-26-1956  Age: 68 y.o. MRN: [id number]  Chief Complaint  Patient presents with   Medical Management of Chronic Issues    HPI Discussed the use of AI scribe software for clinical note transcription with the patient, who gave verbal consent to proceed.  History of Present Illness        Theresa Duncan is a 68 year old female with atrial fibrillation and anxiety who presents for medication management and follow-up.  She experiences episodes of atrial fibrillation, with the most recent episode occurring yesterday morning and lasting approximately two hours. During these episodes, her heart rate ranges from 90 to 113 beats per minute, monitored using a finger pulse oximeter. She is currently taking metoprolol, and her heart rate sometimes drops into the 50s when at rest. She is also on Eliquis. She feels anxious about potential episodes, which affects her willingness to travel. patient has discussed this with  her cardiologist.   She has a history of anxiety and uses Xanax  as needed, though infrequently. She mentions needing a new prescription for Xanax . She has  been diagnosed with an adjustment disorder and has started seeing a psychotherapist named Josh Steed at [facility] in [place]. She has attended one session and has two more appointments scheduled. She has concerns about privacy regarding her psychotherapy records.  She is considering lifestyle changes and has purchased books on the Mediterranean diet, which she plans to follow to improve her health. She has been making side salads with ingredients like romaine, olives, red onion, and mozzarella cheese. She is aware of the benefits of this diet, including increased intake of fish, fresh fruits, and vegetables, and reduced intake of red meat and dairy.  She discusses her husband's health, who had a CVA few years ago, noting that he has been sleeping more than usual, which concerns her. She is trying to encourage him to be more active to prevent complications like blood clots. She is actively involved in her husband's care and manages multiple appointments for both herself and her husband.  She mentions a personal history of a mini-stroke and breast cancer, which have impacted her energy levels in the past, but she feels her energy has improved over the last year. She is consistent with her medication regimen and mentions a previous stroke and atrial fibriallation/heart issues, which she feels should qualify her for weight loss medications, but her insurance does not cover them. she was told if she had OSA  or prediabetes her insurance may cover GLP-1 agonists.  Physical Exam VITALS: P- 62 CARDIOVASCULAR: NSR heart rhythm with occasional skipped beats, regular overall. Results LABS Cholesterol: Within normal limits (02/2024)  DIAGNOSTIC EKG: Normal done at cardiology office  Assessment & Plan Atrial fibrillation Intermittent episodes with tolerable heart rate. No medication changes due to low resting heart rate concerns. On Eliquis for anticoagulation. Ablation considered if episodes increase or  become symptomatic. Home sleep study ordered for potential sleep apnea. patient cannot stay over night at hospital due to caretaking responsibilies - Continue Eliquis for anticoagulation. - Wear heart monitor for one month before next cardiology appointment. - Consider ablation if episodes increase or become symptomatic. - Order home sleep study for potential sleep apnea.  Premature ventricular contractions Occasional PVCs noted, benign and not indicative of atrial fibrillation. - Reassured about benign nature of PVCs.  Adjustment disorder with anxiety Related to life changes and caregiving. Monthly psychotherapy scheduled. Uses Xanax  as needed.patient declines starting daily medication for anxiety - Continue monthly psychotherapy. - Renew Xanax  prescription as needed.  Obesity Discussed management with Wegovy  for weight loss. Aware of side effects and willing to start low dose. Exploring Mediterranean diet. Insurance coverage uncertain. - Prescribe Wegovy  at 0.25 mg. - Monitor for side effects, especially nausea and future episodes of pancreatitis. - Encourage Mediterranean diet adherence. - Encourage Silver Sneakers participation.  Hyperlipidemia Recent cholesterol levels satisfactory. - Renew rosuvastatin  prescription.  Insomnia    The ASCVD Risk score (Arnett DK, et al., 2019) failed to calculate for the following reasons:   Risk score cannot be calculated because patient has a medical history suggesting prior/existing ASCVD  Past Medical History:  Diagnosis Date   Anxiety    chronic, panic attacks   Atrial fibrillation (HCC) 06/02/2010   treated with Cardiazem and Lovenox at [facility], now ASA   Cancer [facility])    breast   Colon polyp    tubular adenoma   Hypertension  Hypothyroidism    Migraine    Personal history of radiation therapy    Tachycardia 05/2010   Thyroid disease 05/2010   hypothyroid   Past Surgical History:  Procedure Laterality Date    APPENDECTOMY  1967   BREAST BIOPSY     BREAST BIOPSY Right 11/28/2022   MM RT BREAST BX W LOC DEV 1ST LESION IMAGE BX SPEC STEREO GUIDE 11/28/2022 [facility]   BREAST BIOPSY  01/20/2023   MM RT RADIOACTIVE SEED LOC MAMMO GUIDE 01/20/2023 [facility]   BREAST BIOPSY  01/20/2023   MM RT RADIOACTIVE SEED EA ADD LESION LOC MAMMO GUIDE 01/20/2023 [facility]   BREAST BIOPSY  01/20/2023   MM LT RADIOACTIVE SEED LOC MAMMO GUIDE 01/20/2023 [facility]   BREAST BIOPSY  01/20/2023   MM LT RADIOACTIVE SEED EA ADD LESION LOC MAMMO GUIDE 01/20/2023 [facility]   BREAST LUMPECTOMY Left 10/2017   BREAST LUMPECTOMY Bilateral 01/22/2023   Right-ILC, Left-DCIS, ALH   BREAST LUMPECTOMY WITH RADIOACTIVE SEED AND SENTINEL LYMPH NODE BIOPSY Right 01/22/2023   Procedure: RIGHT BREAST LUMPECTOMY WITH RADIOACTIVE SEED X2 AND SENTINEL LYMPH NODE BIOPSY;  Surgeon: Aron Shoulders, MD;  Location: [facility] OR;  Service: General;  Laterality: Right;   BREAST LUMPECTOMY WITH RADIOACTIVE SEED LOCALIZATION Left 11/18/2017   Procedure: LEFT BREAST LUMPECTOMY WITH RADIOACTIVE SEED LOCALIZATION;  Surgeon: Aron Shoulders, MD;  Location: [facility];  Service: General;  Laterality: Left;   BREAST LUMPECTOMY WITH RADIOACTIVE SEED LOCALIZATION Left 01/22/2023   Procedure: LEFT BREAST LUMPECTOMY WITH RADIOACTIVE SEED LOCALIZATION X2;  Surgeon: Aron Shoulders, MD;  Location: [facility] OR;  Service: General;  Laterality: Left;   HERNIA REPAIR N/A    WISDOM TOOTH EXTRACTION     Social History   Tobacco Use   Smoking status: Former    Current packs/day: 0.00    Average packs/day: 1 pack/day for 30.0 years (30.0 ttl pk-yrs)    Types: Cigarettes    Start date: 07/05/1980    Quit date: 07/05/2010    Years since quitting: 13.8   Smokeless tobacco: Never  Vaping Use   Vaping status: Never Used  Substance Use Topics   Alcohol use: No   Drug use: No   Family History  Problem Relation Age of Onset    Diabetes Mother    Liver disease Mother    Cirrhosis Mother    Breast cancer Sister 24   Thyroid disease Sister    Heart failure Sister        rheumatic fever   Rheumatic fever Sister    Allergies  Allergen Reactions   Penicillins Hives   Adhesive [Tape] Rash    band-aid   Enoxaparin Rash    ROS    Objective:    BP 126/84   Pulse 62   Temp 98.5 F (36.9 C)   Ht 4' 11.75 (1.518 m)   Wt 213 lb 6 oz (96.8 kg)   LMP 02/27/2010   SpO2 98%   BMI 42.02 kg/m  BP Readings from Last 3 Encounters:  05/02/24 126/84  02/29/24 (!) 146/80  01/13/24 126/86   Wt Readings from Last 3 Encounters:  05/02/24 213 lb 6 oz (96.8 kg)  03/07/24 212 lb 8 oz (96.4 kg)  02/29/24 211 lb 8 oz (95.9 kg)    Physical Exam Vitals and nursing note reviewed.  Constitutional:      General: She is not in acute distress.    Appearance: Normal appearance.  HENT:  Head: Normocephalic.     Right Ear: Tympanic membrane, ear canal and external ear normal.     Left Ear: Tympanic membrane, ear canal and external ear normal.  Eyes:     Extraocular Movements: Extraocular movements intact.     Pupils: Pupils are equal, round, and reactive to light.  Cardiovascular:     Rate and Rhythm: Normal rate and regular rhythm.     Heart sounds: Normal heart sounds.     Comments: Has occasional PVC noted Pulmonary:     Effort: Pulmonary effort is normal.     Breath sounds: Normal breath sounds.  Musculoskeletal:     Right lower leg: No edema.     Left lower leg: No edema.  Neurological:     General: No focal deficit present.     Mental Status: She is alert and oriented to person, place, and time.  Psychiatric:        Attention and Perception: Attention normal.        Mood and Affect: Mood normal.        Speech: Speech normal.        Behavior: Behavior normal.        Thought Content: Thought content normal.        Cognition and Memory: Cognition normal.        Judgment: Judgment normal.      No  results found for any visits on 05/02/24.

## 2024-05-20 ENCOUNTER — Other Ambulatory Visit: Payer: Self-pay

## 2024-05-20 DIAGNOSIS — C50311 Malignant neoplasm of lower-inner quadrant of right female breast: Secondary | ICD-10-CM

## 2024-05-23 ENCOUNTER — Inpatient Hospital Stay: Payer: MEDICARE

## 2024-05-23 ENCOUNTER — Inpatient Hospital Stay: Payer: MEDICARE | Admitting: Hematology

## 2024-05-23 ENCOUNTER — Telehealth: Payer: Self-pay | Admitting: Nurse Practitioner

## 2024-05-23 NOTE — Telephone Encounter (Signed)
 Pt called in to get  rescheduled her  appt with Dr. Lanny.

## 2024-05-23 NOTE — Assessment & Plan Note (Deleted)
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 -Started adjuvant anastrozole  in December 2024.

## 2024-05-25 ENCOUNTER — Ambulatory Visit (HOSPITAL_COMMUNITY): Payer: MEDICARE | Admitting: Licensed Clinical Social Worker

## 2024-06-01 ENCOUNTER — Telehealth: Payer: Self-pay

## 2024-06-01 NOTE — Telephone Encounter (Signed)
 Copied from CRM #[id number]. Topic: Appointments - Scheduling Inquiry for Clinic >> Jun 01, 2024  3:20 PM Tobias L wrote: Reason for CRM: Patient returning call to schedule AWV. Unable to schedule, decision tree only allowing physical to be scheduled.   Patient requesting callback: [phone]

## 2024-06-02 NOTE — Telephone Encounter (Signed)
 Called to schedule pt. No contact made. Lvm for call back.

## 2024-06-05 NOTE — Progress Notes (Unsigned)
 [facility]   Telephone:([phone]) [phone] [phone]    Patient Care Team: Aletha Bene, MD as PCP - General (Family Medicine) Aron Shoulders, MD as Consulting Physician (General Surgery) Lanny Callander, MD as Consulting Physician (Hematology)   CHIEF COMPLAINT: Follow up right breast cancer and h/o left breast LCIS  Oncology History Overview Note  Cancer Staging No matching staging information was found for the patient.      Lobular carcinoma in situ (LCIS) of left breast  09/15/2017 Mammogram   Mammogram 09/15/17 IMPRESSION: Suspicious distortion at 12 o'clock in the left breast.   RECOMMENDATION: Recommend stereotactic biopsy of the left breast distortion    10/02/2017 Initial Biopsy   Diagnosis 10/02/17 Breast, left, needle core biopsy, UOQ at anterior depth - MAMMARY CARCINOMA IN-SITU ARISING IN A COMPLEX SCLEROSING LESION - ATYPICAL LOBULAR HYPERPLASIA - CALCIFICATIONS - SEE COMMENT   11/18/2017 Surgery   LEFT BREAST LUMPECTOMY WITH RADIOACTIVE SEED LOCALIZATION by Dr Aron 11/18/17    11/18/2017 Pathology Results   Diagnosis 11/18/17 Breast, lumpectomy, Left - LOBULAR CARCINOMA IN-SITU ARISING IN A COMPLEX SCLEROSING LESION - INTRADUCTAL PAPILLOMA - PREVIOUS BIOPSY SITE CHANGES - CALCIFICATIONS - SEE COMMENT   12/18/2017 Initial Diagnosis   Lobular carcinoma in situ (LCIS) of left breast    Anti-estrogen oral therapy   She declined antiestrogen therapy.    10/31/2022 Mammogram   Diagnostic mammogram right breast with ultrasound IMPRESSION: 1. Persistent focal asymmetry with associated subtle architectural distortion and scattered calcifications involving the LOWER INNER QUADRANT of the RIGHT breast without sonographic correlate. 2. Possible second focus of architectural distortion in the central RIGHT breast (retroareolar location at middle depth) identified on the full field mediolateral view, again without sonographic correlate. There  is no correlate on the screening CC or MLO views. 3. No pathologic RIGHT axillary lymphadenopathy.  RECOMMENDATION: Stereotactic tomosynthesis core needle biopsy of the asymmetry/distortion associated with calcifications in the LOWER INNER QUADRANT of the RIGHT breast and the second focus of architectural distortion in the central RIGHT breast (if this persists at the time of biopsy).   12/12/2022 Imaging   MRI bilateral breast IMPRESSION: 1. Biopsy-proven malignancy within the LOWER RIGHT breast, with entire area of non masslike enhancement and nodular components measuring 3 x 2.5 x 2.5 cm. 2. Indeterminate 4 cm area of non masslike enhancement within the anterior to middle depth UPPER RIGHT breast. Tissue sampling recommended. 3. Indeterminate 2.5 cm anterior LOWER INNER LEFT breast non masslike enhancement. Tissue sampling recommended. 4. Indeterminate 3.5 cm non masslike enhancement with some nodular components in the UPPER LEFT breast. Tissue sampling recommended.   RECOMMENDATION: MR guided biopsies of indeterminate UPPER RIGHT breast non masslike enhancement, indeterminate LOWER LEFT breast non masslike enhancement and indeterminate UPPER LEFT breast non masslike enhancement.   BI-RADS CATEGORY  4: Suspicious   10/27/2023 Mammogram   3D bilateral diagnostic mammogram  IMPRESSION: 1. Expected postsurgical changes in the bilateral breasts. 2. No mammographic evidence of malignancy bilaterally.   RECOMMENDATION: Diagnostic bilateral mammogram in 1 year.  BI-RADS CATEGORY  2: Benign.     Malignant neoplasm of lower-inner quadrant of right breast of female, estrogen receptor positive (HCC)  12/12/2022 Initial Diagnosis   Malignant neoplasm of lower-inner quadrant of right breast of female, estrogen receptor positive (HCC)   05/23/2024 Cancer Staging   Staging form: Breast, AJCC 8th Edition - Pathologic: Stage IA (pT1a, pN0, cM0, G1, ER+, PR+, HER2-) - Signed by Lanny Callander, MD on  05/23/2024 Histologic  grading system: 3 grade system Residual tumor (R): R0      CURRENT THERAPY: Anastrozole   INTERVAL HISTORY Theresa Duncan returns for follow up as scheduled. Last seen by Powell NP 11/24/23. Continues anastrozole .   ROS   Past Medical History:  Diagnosis Date   Anxiety    chronic, panic attacks   Atrial fibrillation (HCC) 06/02/2010   treated with Cardiazem and Lovenox at [facility], now ASA   Cancer [facility])    breast   Colon polyp    tubular adenoma   Hypertension    Hypothyroidism    Migraine    Personal history of radiation therapy    Tachycardia 05/2010   Thyroid disease 05/2010   hypothyroid     Past Surgical History:  Procedure Laterality Date   APPENDECTOMY  1967   BREAST BIOPSY     BREAST BIOPSY Right 11/28/2022   MM RT BREAST BX W LOC DEV 1ST LESION IMAGE BX SPEC STEREO GUIDE 11/28/2022 [facility]   BREAST BIOPSY  01/20/2023   MM RT RADIOACTIVE SEED LOC MAMMO GUIDE 01/20/2023 [facility]   BREAST BIOPSY  01/20/2023   MM RT RADIOACTIVE SEED EA ADD LESION LOC MAMMO GUIDE 01/20/2023 [facility]   BREAST BIOPSY  01/20/2023   MM LT RADIOACTIVE SEED LOC MAMMO GUIDE 01/20/2023 [facility]   BREAST BIOPSY  01/20/2023   MM LT RADIOACTIVE SEED EA ADD LESION LOC MAMMO GUIDE 01/20/2023 [facility]   BREAST LUMPECTOMY Left 10/2017   BREAST LUMPECTOMY Bilateral 01/22/2023   Right-ILC, Left-DCIS, ALH   BREAST LUMPECTOMY WITH RADIOACTIVE SEED AND SENTINEL LYMPH NODE BIOPSY Right 01/22/2023   Procedure: RIGHT BREAST LUMPECTOMY WITH RADIOACTIVE SEED X2 AND SENTINEL LYMPH NODE BIOPSY;  Surgeon: Aron Shoulders, MD;  [facility];  Service: General;  Laterality: Right;   BREAST LUMPECTOMY WITH RADIOACTIVE SEED LOCALIZATION Left 11/18/2017   Procedure: LEFT BREAST LUMPECTOMY WITH RADIOACTIVE SEED LOCALIZATION;  Surgeon: Aron Shoulders, MD;  Location: [facility];  Service: General;  Laterality: Left;   BREAST LUMPECTOMY  WITH RADIOACTIVE SEED LOCALIZATION Left 01/22/2023   Procedure: LEFT BREAST LUMPECTOMY WITH RADIOACTIVE SEED LOCALIZATION X2;  Surgeon: Aron Shoulders, MD;  Location: [facility];  Service: General;  Laterality: Left;   HERNIA REPAIR N/A    WISDOM TOOTH EXTRACTION       Outpatient Encounter Medications as of 06/07/2024  Medication Sig   acetaminophen  (TYLENOL ) 500 MG tablet Take 1,000 mg by mouth every 6 (six) hours as needed for moderate pain or mild pain.   ALPRAZolam  (XANAX ) 0.25 MG tablet Take 1 tablet (0.25 mg total) by mouth daily as needed for anxiety.   anastrozole  (ARIMIDEX ) 1 MG tablet TAKE 1 TABLET BY MOUTH EVERY DAY   apixaban (ELIQUIS) 5 MG TABS tablet Take 5 mg by mouth 2 (two) times daily.   Cholecalciferol (VITAMIN D3) 2000 UNITS TABS Take 2,000 Units by mouth daily.   diltiazem (CARDIZEM CD) 180 MG 24 hr capsule Take 180 mg by mouth daily.   flecainide  (TAMBOCOR ) 100 MG tablet Take 0.5-1 tablets (50-100 mg total) by mouth 2 (two) times daily. Take 100 mg in the am and 100 mg at bedtime   GLUCOSAMINE SULFATE PO Take 1,500 mg by mouth daily.   levothyroxine  (SYNTHROID ) 112 MCG tablet Take 1 tablet (112 mcg total) by mouth daily before breakfast.   losartan  (COZAAR ) 50 MG tablet Take 1 tablet (50 mg total) by mouth daily.   meclizine (ANTIVERT) 25 MG tablet Take 25 mg by  mouth 3 (three) times daily as needed for dizziness.   metoprolol succinate (TOPROL-XL) 25 MG 24 hr tablet Take 25 mg by mouth daily.   Multiple Vitamin (MULTIVITAMIN) tablet Take 1 tablet by mouth daily.   rosuvastatin  (CRESTOR ) 10 MG tablet Take 1 tablet by mouth daily.   rosuvastatin  (CRESTOR ) 10 MG tablet Take 1 tablet (10 mg total) by mouth daily.   rosuvastatin  (CRESTOR ) 10 MG tablet Take 10 mg by mouth daily.   Semaglutide -Weight Management (WEGOVY ) 0.25 MG/0.5ML SOAJ Inject 0.25 mg into the skin in the morning and at bedtime.   triamcinolone cream (KENALOG) 0.1 % Apply 1 Application topically 2 (two) times  daily.   trolamine salicylate (ASPERCREME) 10 % cream Apply 1 Application topically daily as needed (lower back pain).   valACYclovir (VALTREX) 500 MG tablet Take 500 mg by mouth daily as needed (Flare-up).   No facility-administered encounter medications on file as of 06/07/2024.     There were no vitals filed for this visit. There is no height or weight on file to calculate BMI.   ECOG PERFORMANCE STATUS: {CHL ONC ECOG [phone]}  PHYSICAL EXAM GENERAL:alert, no distress and comfortable SKIN: no rash  EYES: sclera clear NECK: without mass LYMPH:  no palpable cervical or supraclavicular lymphadenopathy  LUNGS: clear with normal breathing effort HEART: regular rate & rhythm, no lower extremity edema ABDOMEN: abdomen soft, non-tender and normal bowel sounds NEURO: alert & oriented x 3 with fluent speech, no focal motor/sensory deficits Breast exam:  PAC without erythema    CBC    Latest Ref Rng & Units 11/24/2023   11:57 AM 08/25/2023   10:44 AM 04/22/2023   10:30 AM  CBC  WBC 4.0 - 10.5 K/uL 7.7  7.7  5.7   Hemoglobin 12.0 - 15.0 g/dL 85.6  86.2  85.7   Hematocrit 36.0 - 46.0 % 42.4  41.0  40.7   Platelets 150 - 400 K/uL 231  221  190       CMP     Latest Ref Rng & Units 02/29/2024   12:43 PM 11/24/2023   11:57 AM 08/25/2023   10:44 AM  CMP  Glucose 65 - 99 mg/dL 90  93  877   BUN 7 - 25 mg/dL 14  14  15    Creatinine 0.50 - 1.05 mg/dL 9.32  9.30  9.27   Sodium 135 - 146 mmol/L 140  140  140   Potassium 3.5 - 5.3 mmol/L 4.1  4.6  4.4   Chloride 98 - 110 mmol/L 102  103  106   CO2 20 - 32 mmol/L 26  28  26    Calcium  8.6 - 10.4 mg/dL 9.7  9.9  9.6   Total Protein 6.1 - 8.1 g/dL 7.8  7.9  7.7   Total Bilirubin 0.2 - 1.2 mg/dL 0.4  0.4  0.6   Alkaline Phos 38 - 126 U/L  58  63   AST 10 - 35 U/L 14  15  15    ALT 6 - 29 U/L 14  18  15        ASSESSMENT & PLAN: Theresa Duncan is a 68 y.o. female with     Right breast ILC and LCIS, G2, stage IA (pT1aN0), ER/PR +  HER2 - Ki67 5%, cT1a;  -Screening detected R breast distortion and asymmetry spanning 1.6 cm in the lower inner quadrant, biopsy 11/28/22 revealed invasive lobular carcinoma and intermediate grade LCIS.  Lymphovascular invasion was not identified.  ER  100% strongly positive, PR 90% strongly positive with a Ki-67 of 5%, HER2 negative by FISH -She underwent pre-op MRI that showed 3 additional masses requiring biopsies, she had additional R breast LCIS involving a complex sclerosing lesion (as well as multifocal DCIS in the L breast) -She underwent bilateral lumpectomies and R SLNB by Dr. Aron -Final path showed R brast ALH and intermediate grade DCIS and LCIS; no invasive carcinoma; LN negative; all margins negative  -She completed b/l breast radiation 04/15/23   -Given her extensive history and that the component of invasive lobular carcinoma on biopsy was strongly ER and PR positive, we do recommend adjuvant aromatase inhibitor to reduce her risk of cancer recurrence. Due to her concern for thrombosis in the setting of Afib, will reserve Tamoxifen as last resort. Began Anastrozole  after recovering from RT  2. Lobular Carcinoma in-situ of the Left Breast, new DCIS 11/2022  -Diagnosed in 09/2017. S/p left breast lumpectomy. She had complete resection and no invasive cancer.  -She declined chemoprevention with anti-estrogen therapy. -She has been on surveillance with annual mammograms. Additional screening breast MRI was recommended, but she declined due to claustrophobia.  -Pre-op MRI showed additional masses, biopsy 12/24/22 showed multifocal (x2) DCIS with calcifications and necrosis -S/p bilateral lumpectomy 01/22/23, left breast path showed focal ADH, ALH and LCIS, no invasive carcinoma  -She completed bilateral radiation at [facility] 04/15/23, she tolerated with expected dermatitis which she is managing -See #1 for anti-estrogen    3. Bone health -continue calcium  and vitamin D -We recommend baseline  DEXA as she begins AI   PLAN:  No orders of the defined types were placed in this encounter.     All questions were answered. The patient knows to call the clinic with any problems, questions or concerns. No barriers to learning were detected. I spent *** counseling the patient face to face. The total time spent in the appointment was *** and more than 50% was on counseling, review of test results, and coordination of care.   Westley Blass K Jolin Benavides, NP 06/05/2024 4:03 PM

## 2024-06-07 ENCOUNTER — Encounter: Payer: Self-pay | Admitting: Nurse Practitioner

## 2024-06-07 ENCOUNTER — Inpatient Hospital Stay (HOSPITAL_BASED_OUTPATIENT_CLINIC_OR_DEPARTMENT_OTHER): Payer: MEDICARE | Admitting: Nurse Practitioner

## 2024-06-07 ENCOUNTER — Inpatient Hospital Stay: Payer: MEDICARE | Attending: Hematology

## 2024-06-07 VITALS — BP 138/88 | HR 62 | Temp 97.9°F | Resp 17 | Wt 212.9 lb

## 2024-06-07 DIAGNOSIS — Z7901 Long term (current) use of anticoagulants: Secondary | ICD-10-CM | POA: Diagnosis not present

## 2024-06-07 DIAGNOSIS — Z79811 Long term (current) use of aromatase inhibitors: Secondary | ICD-10-CM | POA: Diagnosis not present

## 2024-06-07 DIAGNOSIS — Z1732 Human epidermal growth factor receptor 2 negative status: Secondary | ICD-10-CM | POA: Diagnosis not present

## 2024-06-07 DIAGNOSIS — Z923 Personal history of irradiation: Secondary | ICD-10-CM | POA: Insufficient documentation

## 2024-06-07 DIAGNOSIS — I4891 Unspecified atrial fibrillation: Secondary | ICD-10-CM | POA: Insufficient documentation

## 2024-06-07 DIAGNOSIS — M810 Age-related osteoporosis without current pathological fracture: Secondary | ICD-10-CM | POA: Diagnosis not present

## 2024-06-07 DIAGNOSIS — C50311 Malignant neoplasm of lower-inner quadrant of right female breast: Secondary | ICD-10-CM | POA: Diagnosis not present

## 2024-06-07 DIAGNOSIS — Z8673 Personal history of transient ischemic attack (TIA), and cerebral infarction without residual deficits: Secondary | ICD-10-CM

## 2024-06-07 DIAGNOSIS — Z170 Estrogen receptor positive status [ER+]: Secondary | ICD-10-CM | POA: Insufficient documentation

## 2024-06-07 DIAGNOSIS — Z1721 Progesterone receptor positive status: Secondary | ICD-10-CM | POA: Diagnosis not present

## 2024-06-07 DIAGNOSIS — D0502 Lobular carcinoma in situ of left breast: Secondary | ICD-10-CM

## 2024-06-07 LAB — CBC WITH DIFFERENTIAL (CANCER CENTER ONLY)
Abs Immature Granulocytes: 0.02 K/uL (ref 0.00–0.07)
Basophils Absolute: 0 K/uL (ref 0.0–0.1)
Basophils Relative: 1 %
Eosinophils Absolute: 0.1 K/uL (ref 0.0–0.5)
Eosinophils Relative: 2 %
HCT: 40.6 % (ref 36.0–46.0)
Hemoglobin: 14 g/dL (ref 12.0–15.0)
Immature Granulocytes: 0 %
Lymphocytes Relative: 21 %
Lymphs Abs: 1.2 K/uL (ref 0.7–4.0)
MCH: 31.5 pg (ref 26.0–34.0)
MCHC: 34.5 g/dL (ref 30.0–36.0)
MCV: 91.4 fL (ref 80.0–100.0)
Monocytes Absolute: 0.6 K/uL (ref 0.1–1.0)
Monocytes Relative: 9 %
Neutro Abs: 4 K/uL (ref 1.7–7.7)
Neutrophils Relative %: 67 %
Platelet Count: 214 K/uL (ref 150–400)
RBC: 4.44 MIL/uL (ref 3.87–5.11)
RDW: 12.8 % (ref 11.5–15.5)
WBC Count: 6 K/uL (ref 4.0–10.5)
nRBC: 0 % (ref 0.0–0.2)

## 2024-06-07 LAB — CMP (CANCER CENTER ONLY)
ALT: 15 U/L (ref 0–44)
AST: 15 U/L (ref 15–41)
Albumin: 4.6 g/dL (ref 3.5–5.0)
Alkaline Phosphatase: 65 U/L (ref 38–126)
Anion gap: 7 (ref 5–15)
BUN: 14 mg/dL (ref 8–23)
CO2: 28 mmol/L (ref 22–32)
Calcium: 9.7 mg/dL (ref 8.9–10.3)
Chloride: 103 mmol/L (ref 98–111)
Creatinine: 0.74 mg/dL (ref 0.44–1.00)
GFR, Estimated: >60 mL/min (ref >60)
Glucose, Bld: 81 mg/dL (ref 70–99)
Potassium: 4.1 mmol/L (ref 3.5–5.1)
Sodium: 138 mmol/L (ref 135–145)
Total Bilirubin: 0.6 mg/dL (ref 0.0–1.2)
Total Protein: 7.9 g/dL (ref 6.5–8.1)

## 2024-06-07 MED ORDER — ANASTROZOLE 1 MG PO TABS: 1.0000 mg | ORAL_TABLET | Freq: Every day | ORAL | 3 refills | Status: AC

## 2024-06-08 ENCOUNTER — Encounter: Payer: Self-pay | Admitting: Nurse Practitioner

## 2024-06-10 NOTE — Telephone Encounter (Signed)
AWV scheduled 

## 2024-06-15 ENCOUNTER — Other Ambulatory Visit: Payer: Self-pay

## 2024-06-15 ENCOUNTER — Telehealth: Payer: Self-pay

## 2024-06-15 NOTE — Telephone Encounter (Addendum)
 Emailed Secretary/administrator @[facility] to get patient scheduled.  [email or web address]   ----- Message from Lacie K Burton sent at 06/07/2024 10:28 AM EDT ----- Team,  Powell ordered contrast mammo in 2/205 to be done in 04/2024, pt has not been successful in scheduling the contrast-enhanced mammogram yet. Could you please call breast center to see if they can do CEM this month (takes place of screening MRI due to claustrophobia).  If not able to schedule until later this year (nov/dec), then please arrange for it to be done in January at the time of her annual mammo.  Thanks Lacie

## 2024-06-15 NOTE — Telephone Encounter (Signed)
 Opened by mistake.

## 2024-06-17 ENCOUNTER — Ambulatory Visit (HOSPITAL_COMMUNITY): Payer: MEDICARE | Admitting: Licensed Clinical Social Worker

## 2024-06-20 ENCOUNTER — Ambulatory Visit: Payer: MEDICARE

## 2024-07-12 ENCOUNTER — Ambulatory Visit: Payer: MEDICARE | Attending: General Surgery | Admitting: Rehabilitation

## 2024-07-12 ENCOUNTER — Encounter: Payer: Self-pay | Admitting: Rehabilitation

## 2024-07-12 DIAGNOSIS — Z483 Aftercare following surgery for neoplasm: Secondary | ICD-10-CM | POA: Insufficient documentation

## 2024-07-12 DIAGNOSIS — C50311 Malignant neoplasm of lower-inner quadrant of right female breast: Secondary | ICD-10-CM | POA: Insufficient documentation

## 2024-07-12 DIAGNOSIS — Z170 Estrogen receptor positive status [ER+]: Secondary | ICD-10-CM | POA: Insufficient documentation

## 2024-07-12 DIAGNOSIS — Z853 Personal history of malignant neoplasm of breast: Secondary | ICD-10-CM | POA: Insufficient documentation

## 2024-07-12 NOTE — Therapy (Signed)
 OUTPATIENT PHYSICAL THERAPY SOZO SCREENING NOTE   Patient Name: Theresa Duncan MRN: [id number] DOB:12-09-1955, 68 y.o., female Today's Date: 07/12/2024  PCP: Aletha Bene, MD REFERRING PROVIDER: Aron Shoulders, MD   PT End of Session - 07/12/24 1627     Visit Number 3   screen only   PT Start Time 1500    PT Stop Time 1505    PT Time Calculation (min) 5 min    Activity Tolerance Patient tolerated treatment well    Behavior During Therapy WFL for tasks assessed/performed          Past Medical History:  Diagnosis Date   Anxiety    chronic, panic attacks   Atrial fibrillation (HCC) 06/02/2010   treated with Cardiazem and Lovenox at [facility], now ASA   Cancer [facility])    breast   Colon polyp    tubular adenoma   Hypertension    Hypothyroidism    Migraine    Personal history of radiation therapy    Tachycardia 05/2010   Thyroid disease 05/2010   hypothyroid   Past Surgical History:  Procedure Laterality Date   APPENDECTOMY  1967   BREAST BIOPSY     BREAST BIOPSY Right 11/28/2022   MM RT BREAST BX W LOC DEV 1ST LESION IMAGE BX SPEC STEREO GUIDE 11/28/2022 [facility]   BREAST BIOPSY  01/20/2023   MM RT RADIOACTIVE SEED LOC MAMMO GUIDE 01/20/2023 [facility]   BREAST BIOPSY  01/20/2023   MM RT RADIOACTIVE SEED EA ADD LESION LOC MAMMO GUIDE 01/20/2023 [facility]   BREAST BIOPSY  01/20/2023   MM LT RADIOACTIVE SEED LOC MAMMO GUIDE 01/20/2023 [facility]   BREAST BIOPSY  01/20/2023   MM LT RADIOACTIVE SEED EA ADD LESION LOC MAMMO GUIDE 01/20/2023 [facility]   BREAST LUMPECTOMY Left 10/2017   BREAST LUMPECTOMY Bilateral 01/22/2023   Right-ILC, Left-DCIS, ALH   BREAST LUMPECTOMY WITH RADIOACTIVE SEED AND SENTINEL LYMPH NODE BIOPSY Right 01/22/2023   Procedure: RIGHT BREAST LUMPECTOMY WITH RADIOACTIVE SEED X2 AND SENTINEL LYMPH NODE BIOPSY;  Surgeon: Aron Shoulders, MD;  Location: [facility];  Service: General;  Laterality: Right;   BREAST  LUMPECTOMY WITH RADIOACTIVE SEED LOCALIZATION Left 11/18/2017   Procedure: LEFT BREAST LUMPECTOMY WITH RADIOACTIVE SEED LOCALIZATION;  Surgeon: Aron Shoulders, MD;  Location:  SURGERY CENTER;  Service: General;  Laterality: Left;   BREAST LUMPECTOMY WITH RADIOACTIVE SEED LOCALIZATION Left 01/22/2023   Procedure: LEFT BREAST LUMPECTOMY WITH RADIOACTIVE SEED LOCALIZATION X2;  Surgeon: Aron Shoulders, MD;  Location: [facility];  Service: General;  Laterality: Left;   HERNIA REPAIR N/A    WISDOM TOOTH EXTRACTION     Patient Active Problem List   Diagnosis Date Noted   Ganglion of hand 02/29/2024   Blue nevus of nose 01/13/2024   Cherry angioma 01/13/2024   Lentigo 01/13/2024   Mucous cyst of digit of right hand 01/13/2024   Neoplasm of uncertain behavior of skin 01/13/2024   Nevus of face 01/13/2024   BMI 40.0-44.9, adult (HCC) 01/13/2024   TIA (transient ischemic attack) 01/13/2024   Greater trochanteric bursitis of right hip 09/21/2023   Gallstone pancreatitis 07/03/2023   Malignant neoplasm of lower-inner quadrant of right breast of female, estrogen receptor positive (HCC) 12/12/2022   Adenomatous polyp of colon 12/08/2022   Claustrophobia 12/08/2022   S/P hernia repair 05/20/2021   Small bowel obstruction (HCC) 05/19/2021   Atrial flutter (HCC) 03/08/2021   Lobular carcinoma in situ (LCIS) of left breast 12/18/2017  Situational anxiety 10/15/2017   Palpitations 10/01/2016   Hair loss 07/15/2016   Seborrheic keratoses 07/15/2016   Hyperlipidemia 12/20/2014   Atrial fibrillation (HCC) 05/16/2014   Essential hypertension 05/16/2014   PVCs (premature ventricular contractions) 05/16/2014   Chronic anxiety 07/05/2013   Other specified hypothyroidism 07/05/2013   Morbid obesity (HCC) 07/05/2013   Vitamin D deficiency 07/05/2013    REFERRING DIAG: left breast cancer at risk for lymphedema  THERAPY DIAG:  Aftercare following surgery for neoplasm  Malignant neoplasm of  lower-inner quadrant of right breast of female, estrogen receptor positive (HCC)  History of left breast cancer  PERTINENT HISTORY: Hx of Lt LCIS in 2018 with lumpectomy and no nodes removed. Now 2 spots on the Rt and 2 spots on the left. Underwent Lt lumpectomy 01/22/23, x 2 Rt lumpectomy x 2 and Rt SLNB with 2 negative nodes removed. Only the Rt side was cancer. Will do radiation bilaterally x 3 weeks.   PRECAUTIONS: right UE Lymphedema risk  SUBJECTIVE: Pt returns for her 3 month L-Dex screen.   PAIN:  Are you having pain? No  SOZO SCREENING: Patient was assessed today using the SOZO machine to determine the lymphedema index score. This was compared to her baseline score. It was determined that she is within the recommended range when compared to her baseline and no further action is needed at this time. She will continue SOZO screenings. These are done every 3 months for 2 years post operatively followed by every 6 months for 2 years, and then annually.   L-DEX FLOWSHEETS - 07/12/24 1600       L-DEX LYMPHEDEMA SCREENING   Measurement Type Unilateral    L-DEX MEASUREMENT EXTREMITY Upper Extremity    POSITION  Standing    DOMINANT SIDE Right    At Risk Side Right    BASELINE SCORE (UNILATERAL) -4.2    L-DEX SCORE (UNILATERAL) -6.7    VALUE CHANGE (UNILAT) -2.5          Saddie Raw, PT 07/12/24, 4:28 PM   07/12/24 4:28 PM

## 2024-07-13 ENCOUNTER — Ambulatory Visit (HOSPITAL_COMMUNITY): Payer: MEDICARE | Admitting: Licensed Clinical Social Worker

## 2024-07-13 ENCOUNTER — Encounter (HOSPITAL_COMMUNITY): Payer: Self-pay

## 2024-07-13 DIAGNOSIS — F4322 Adjustment disorder with anxiety: Secondary | ICD-10-CM | POA: Diagnosis not present

## 2024-07-13 NOTE — Progress Notes (Unsigned)
 THERAPIST PROGRESS NOTE  Session Time: 3:00 pm-3:45 pm  Type of Therapy: Individual Therapy  Session# 1   Treatment Goals: Theresa Duncan will reduce overall frequency, intensity, and duration of episodes of high anxiety so that functioning is no longer impaired   Session Goals: Theresa Duncan will attend and actively participate in a weekly social activities or vocational skills group for 6 consecutive weeks, reporting a post-session comfort level of >=4/5.   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 She has opportunities to do things but chooses to stay home because she worries about leaving her husband at home. Patient was oriented x4 (person, place, situation, and time) . Patient was  Anxious. Patient was Casual. Patient made no progress on her goals at this time.   Interventions: Therapist collaborated with patient to complete treatment plan. Therapist discussed patient's expectations for therapy, for therapist, therapist expectations for patient, and how patient will know when treatment is successful. Therapist provided psychoeducation on CBT and how thoughts, feelings, and behaviors    Response:  Patient was able to verbalize goals for treatment. Patient understood CBT. She saw how her thoughts stop her from living her life. She feels like she does too much to her husband and he probably wants to do things for himself as well as have space from her. She feels like she needs to go see her friends, travel, etc  Patient engaged in session. Patient responded well to interventions. Patient continues to meet criteria for Adjustment disorder with anxious mood  Patient will continue in outpatient therapy due to being the least restrictive service to meet her needs.   Plan: Patient will return in 2-4 weeks. Patient agreed to pay attention to thoughts when anxiety gets high or thoughts about not wanting to do things like go for a walk or see friends.    Suicidal/Homicidal:  No without intent/plan   Recent stressful life event  Protective Factors: positive social support and responsibility to others (children, family)   Collaboration of Care: Primary Care Provider AEB Dr. Connie Desanctis, MD  Patient/Guardian was advised Release of Information must be obtained prior to any record release in order to collaborate their care with an outside provider. Patient/Guardian was advised if they have not already done so to contact the registration department to sign all necessary forms in order for us  to release information regarding their care.   Consent: Patient/Guardian gives verbal consent for treatment and assignment of benefits for services provided during this visit. Patient/Guardian expressed understanding and agreed to proceed.

## 2024-08-04 ENCOUNTER — Ambulatory Visit (HOSPITAL_COMMUNITY): Payer: MEDICARE | Admitting: Licensed Clinical Social Worker

## 2024-08-14 ENCOUNTER — Other Ambulatory Visit: Payer: Self-pay | Admitting: Family Medicine

## 2024-08-14 DIAGNOSIS — I10 Essential (primary) hypertension: Secondary | ICD-10-CM

## 2024-08-16 ENCOUNTER — Ambulatory Visit: Payer: MEDICARE | Admitting: Family Medicine

## 2024-08-16 NOTE — Telephone Encounter (Signed)
 Requested Prescriptions  Pending Prescriptions Disp Refills   losartan  (COZAAR ) 50 MG tablet [Pharmacy Med Name: LOSARTAN  POTASSIUM 50 MG TAB] 90 tablet 1    Sig: TAKE 1 TABLET BY MOUTH EVERY DAY     Cardiovascular:  Angiotensin Receptor Blockers Passed - 08/16/2024  2:19 PM      Passed - Cr in normal range and within 180 days    Creatinine  Date Value Ref Range Status  06/07/2024 0.74 0.44 - 1.00 mg/dL Final   Creat  Date Value Ref Range Status  02/29/2024 0.67 0.50 - 1.05 mg/dL Final         Passed - K in normal range and within 180 days    Potassium  Date Value Ref Range Status  06/07/2024 4.1 3.5 - 5.1 mmol/L Final         Passed - Patient is not pregnant      Passed - Last BP in normal range    BP Readings from Last 1 Encounters:  06/07/24 138/88         Passed - Valid encounter within last 6 months    Recent Outpatient Visits           3 months ago Adjustment disorder with anxious mood   [place] [facility] Aletha Bene, MD   5 months ago Essential hypertension   [place] Aletha Bene, MD   7 months ago TIA (transient ischemic attack)   [facility] Aletha Bene, MD   9 months ago Essential hypertension   [place] [facility] Aletha Bene, MD       Future Appointments             In 1 month Sandridge, Brenda K, PA-C [facility]

## 2024-08-22 ENCOUNTER — Ambulatory Visit (INDEPENDENT_AMBULATORY_CARE_PROVIDER_SITE_OTHER): Payer: MEDICARE | Admitting: Licensed Clinical Social Worker

## 2024-08-22 DIAGNOSIS — F4322 Adjustment disorder with anxiety: Secondary | ICD-10-CM

## 2024-08-22 NOTE — Progress Notes (Unsigned)
 THERAPIST PROGRESS NOTE  Session Time: 1:50 pm-2:50 pm  Type of Therapy: Individual Therapy  Session# 2   Treatment Goals: Theresa Duncan will reduce overall frequency, intensity, and duration of episodes of high anxiety so that functioning is no longer impaired   Session Goals: Theresa Duncan will attend and actively participate in a weekly social activities or vocational skills group for 6 consecutive weeks, reporting a post-session comfort level of >=4/5.   Behavior: Patient has been paying attention to her thoughts and changing her reactions. Patient has been trying to stay in the present. She has not been dwelling on things before her husband got sick and focused on the now.  Patient was oriented x4 (person, place, situation, and time) . Patient was  Anxious. Patient was Casual. Patient made no progress on her goals at this time.   Interventions: Therapist used CBT interventions of cognitive restructuring to work with Nathanel to identify a minimum of 3 consequences of avoidance.    Response: Patient has been starting projects such as getting the deck built and working on the kitchen. She had delayed doing these due to her husband's health. She has realized that she doesn't have time to wait. She doesn't have time to sit, hope, wish, and pray that things get better she has to take action. Patient is realized she has a lot of bowls and other items at home. She is going to declutter. She is willing to be intentional about her thoughts, actions, and decisions about her life as well as her stuff around the home. She has even been intentional about not doing everything her husband so that he can be more independent.   Patient engaged in session. Patient responded well to interventions. Patient continues to meet criteria for Adjustment disorder with anxious mood  Patient will continue in outpatient therapy due to being the least restrictive service to meet her needs.   Plan: Patient will return in 2-4  weeks. Patient will be intentional daily about her thoughts and decisions.   Suicidal/Homicidal:  No without intent/plan   Recent stressful life event  Protective Factors: positive social support and responsibility to others (children, family)   Collaboration of Care: Primary Care Provider AEB Dr. Connie Desanctis, MD  Patient/Guardian was advised Release of Information must be obtained prior to any record release in order to collaborate their care with an outside provider. Patient/Guardian was advised if they have not already done so to contact the registration department to sign all necessary forms in order for us  to release information regarding their care.   Consent: Patient/Guardian gives verbal consent for treatment and assignment of benefits for services provided during this visit. Patient/Guardian expressed understanding and agreed to proceed.

## 2024-08-23 ENCOUNTER — Ambulatory Visit (INDEPENDENT_AMBULATORY_CARE_PROVIDER_SITE_OTHER): Payer: MEDICARE | Admitting: Family Medicine

## 2024-08-23 ENCOUNTER — Encounter: Payer: Self-pay | Admitting: Family Medicine

## 2024-08-23 VITALS — BP 122/68 | HR 58 | Temp 98.6°F | Ht 59.75 in | Wt 216.2 lb

## 2024-08-23 DIAGNOSIS — I10 Essential (primary) hypertension: Secondary | ICD-10-CM

## 2024-08-23 DIAGNOSIS — I480 Paroxysmal atrial fibrillation: Secondary | ICD-10-CM

## 2024-08-23 DIAGNOSIS — F4322 Adjustment disorder with anxiety: Secondary | ICD-10-CM

## 2024-08-23 DIAGNOSIS — Z6841 Body Mass Index (BMI) 40.0 and over, adult: Secondary | ICD-10-CM

## 2024-08-23 DIAGNOSIS — E038 Other specified hypothyroidism: Secondary | ICD-10-CM

## 2024-08-23 DIAGNOSIS — E785 Hyperlipidemia, unspecified: Secondary | ICD-10-CM

## 2024-08-23 NOTE — Progress Notes (Unsigned)
 Patient Office Visit  Assessment & Plan:  Essential hypertension -     CBC with Differential/Platelet -     Comprehensive metabolic panel with GFR  Hyperlipidemia, unspecified hyperlipidemia type -     Lipid panel  Other specified hypothyroidism -     TSH  Paroxysmal atrial fibrillation (HCC) -     CBC with Differential/Platelet -     Comprehensive metabolic panel with GFR  Adjustment disorder with anxiety  Morbid obesity with BMI of 40.0-44.9, adult (HCC)   Assessment and Plan    Paroxysmal atrial fibrillation Recent 13-hour episode with heart rate 30-164 bpm. Majority in sinus rhythm. Cardiologist increased flecainide  and metoprolol, improving control. Discussed potential ablation for intermittent AFib. she is on Eliquis - Continue flecainide  100 mg twice per day and Metoprolol and cardiazem. - Consider ablation if AFib episodes persist.  Essential hypertension Blood pressure management ongoing with current medication regimen.  Hyperlipidemia LDL target under 70 mg/dL due to stroke history. Current regimen includes Crestor  10 mg, with potential increase if LDL not at target. - Ordered lipid panel to assess LDL levels. - Increase Crestor  to 20 mg if LDL is not under 70 mg/dL.  Other specified hypothyroidism Thyroid function to be re-evaluated. - Ordered thyroid function tests.  Adjustment disorder with anxiety Ongoing therapy with Josh. Discussed anxiety management strategies. Xanax  available for acute episodes. - Continue therapy sessions with Josh. - Use Xanax  as needed for acute anxiety episodes.  General Health Maintenance Up to date on flu and shingles vaccinations. Discussed weight loss benefits, including reduction in AFib episodes. Discussed GLP-1 receptor agonists for weight loss, noting insurance challenges. - Encouraged healthy diet and exercise. - Discussed potential use of GLP-1 receptor agonists for weight loss if insurance coverage allows.      Test results were reviewed and analyzed as part of the medical decision making of this visit.  Cardiology notes and Holter monitor during the office visit. Recommended that she start using Silver sneakers at her local [employer].  We will see what the sleep study shows and if in fact she does have obstructive sleep apnea her insurance may cover the Zepbound .  Will need to be very cautious with the usage of GLP-1's due to previous history of pancreatitis.  Return in about 3 months (around 11/23/2024), or if symptoms worsen or fail to improve.   Subjective:    Patient ID: Theresa Duncan, female    DOB: [birth year]  Age: 68 y.o. MRN: [id number]  Chief Complaint  Patient presents with  . Medical Management of Chronic Issues    HPI Discussed the use of AI scribe software for clinical note transcription with the patient, who gave verbal consent to proceed.  History of Present Illness       History of Present Illness Theresa Duncan is a 68 year old female with atrial fibrillation and anxiety who presents for follow-up on her paroxyomal atrial fibrillation, hypertension, hyperlipidemia and sleep study issues.  She has ongoing issues with atrial fibrillation and experienced an episode in October that lasted a long time about 13 hours. Her medication regimen includes flecainide  100 mg twice per day, metoprolol 25 mg at night, and cardiazem which she believes has reduced the frequency and duration of episodes. A heart monitor recorded episodes lasting up to 13 hours, with heart rates ranging from 30 to 164 bpm. She has not received follow-up communication regarding these results of holter monitor. She has reviewed the report herself- we did review this  today.  HTN-using antihypertensive medication without difficulty.  Denies associated signs and symptoms including chest pain, shortness of breath, cough headache, peripheral swelling cramps spasms and palpitations. Current medications are working for her.  Possible  OSA- She is experiencing difficulties with a sleep study due to Medicare requirements for a second opinion before proceeding. She has been referred to another facility for this purpose, but the process has been delayed, with an appointment scheduled for December. Hypothyroidism- patient has been on the same dosage for a long time.  She has a history of anxiety and has Xanax  on hand, which she uses as needed. Her anxiety has decreased, and she has not needed to take Xanax  recently. She is also seeing a therapist named Josh to help with adjustment issues related to life changes and has been working on being more intentional in her daily activities.  She is conscious of her diet and tries to control her salt intake by cooking at home. She acknowledges occasional indulgences, such as eating an oatmeal cookie. She is aware of the need to manage her weight, especially in light of her pancreatitis history, and is cautious about medication that could affect this condition. She has not started using her Silver Sneakers through her insurance but will start to do this.   She reports sleeping better, averaging about six hours of sleep per night, and usually wakes up once to use the bathroom. She feels her sleep quality has improved.  She has been managing her husband's care following his health issues (had stroke) over the past three years, which has been a significant source of stress. She is working on taking more time for herself and managing her responsibilities more effectively. She is also being more intentional per her therapist recommendation  Physical Exam CHEST: Lungs clear to auscultation bilaterally.  Results DIAGNOSTIC reviewed today Holter monitor: Heart rate ranged from 30 to 164 bpm, longest atrial fibrillation episode 13 hours 20 minutes, majority in sinus rhythm (October 2025)  Assessment and Plan Paroxysmal atrial fibrillation Recent 13-hour episode with heart rate 30-164 bpm. Majority in  sinus rhythm. Cardiologist increased flecainide  and metoprolol, improving control. Discussed potential ablation for intermittent AFib. she is on Eliquis - Continue flecainide  100 mg twice per day and Metoprolol and cardiazem. - Consider ablation if AFib episodes persist.  Essential hypertension Blood pressure management ongoing with current medication regimen.  Hyperlipidemia LDL target under 70 mg/dL due to stroke history. Current regimen includes Crestor  10 mg, with potential increase if LDL not at target. - Ordered lipid panel to assess LDL levels. - Increase Crestor  to 20 mg if LDL is not under 70 mg/dL.  Other specified hypothyroidism Thyroid function to be re-evaluated. - Ordered thyroid function tests.  Adjustment disorder with anxiety Ongoing therapy with Josh. Discussed anxiety management strategies. Xanax  available for acute episodes. Overall has improved - Continue therapy sessions with Josh. - Use Xanax  as needed for acute anxiety episodes.  General Health Maintenance Up to date on flu and shingles vaccinations. Discussed weight loss benefits, including reduction in AFib episodes. Discussed GLP-1 receptor agonists for weight loss, noting insurance challenges. - Encouraged healthy diet and exercise. - Discussed potential use of GLP-1 receptor agonists for weight loss if insurance coverage allows.    The ASCVD Risk score (Arnett DK, et al., 2019) failed to calculate for the following reasons:   Risk score cannot be calculated because patient has a medical history suggesting prior/existing ASCVD  Past Medical History:  Diagnosis Date  . Anxiety  chronic, panic attacks  . Atrial fibrillation (HCC) 06/02/2010   treated with Cardiazem and Lovenox at [facility], now ASA  . Cancer [facility])    breast  . Colon polyp    tubular adenoma  . Hypertension   . Hypothyroidism   . Migraine   . Personal history of radiation therapy   . Tachycardia 05/2010  . Thyroid disease  05/2010   hypothyroid   Past Surgical History:  Procedure Laterality Date  . APPENDECTOMY  1967  . BREAST BIOPSY    . BREAST BIOPSY Right 11/28/2022   MM RT BREAST BX W LOC DEV 1ST LESION IMAGE BX SPEC STEREO GUIDE 11/28/2022 [facility]  . BREAST BIOPSY  01/20/2023   MM RT RADIOACTIVE SEED LOC MAMMO GUIDE 01/20/2023 [facility]  . BREAST BIOPSY  01/20/2023   MM RT RADIOACTIVE SEED EA ADD LESION LOC MAMMO GUIDE 01/20/2023 [facility]  . BREAST BIOPSY  01/20/2023   MM LT RADIOACTIVE SEED LOC MAMMO GUIDE 01/20/2023 [facility]  . BREAST BIOPSY  01/20/2023   MM LT RADIOACTIVE SEED EA ADD LESION LOC MAMMO GUIDE 01/20/2023 [facility]  . BREAST LUMPECTOMY Left 10/2017  . BREAST LUMPECTOMY Bilateral 01/22/2023   Right-ILC, Left-DCIS, ALH  . BREAST LUMPECTOMY WITH RADIOACTIVE SEED AND SENTINEL LYMPH NODE BIOPSY Right 01/22/2023   Procedure: RIGHT BREAST LUMPECTOMY WITH RADIOACTIVE SEED X2 AND SENTINEL LYMPH NODE BIOPSY;  Surgeon: Aron Shoulders, MD;  Location: [facility];  Service: General;  Laterality: Right;  . BREAST LUMPECTOMY WITH RADIOACTIVE SEED LOCALIZATION Left 11/18/2017   Procedure: LEFT BREAST LUMPECTOMY WITH RADIOACTIVE SEED LOCALIZATION;  Surgeon: Aron Shoulders, MD;  Location: [facility];  Service: General;  Laterality: Left;  . BREAST LUMPECTOMY WITH RADIOACTIVE SEED LOCALIZATION Left 01/22/2023   Procedure: LEFT BREAST LUMPECTOMY WITH RADIOACTIVE SEED LOCALIZATION X2;  Surgeon: Aron Shoulders, MD;  Location: [facility] OR;  Service: General;  Laterality: Left;  . HERNIA REPAIR N/A   . WISDOM TOOTH EXTRACTION     Social History   Tobacco Use  . Smoking status: Former    Current packs/day: 0.00    Average packs/day: 1 pack/day for 30.0 years (30.0 ttl pk-yrs)    Types: Cigarettes    Start date: 07/05/1980    Quit date: 07/05/2010    Years since quitting: 14.1  . Smokeless tobacco: Never  Vaping Use  . Vaping status: Never Used  Substance Use  Topics  . Alcohol use: No  . Drug use: No   Family History  Problem Relation Age of Onset  . Diabetes Mother   . Liver disease Mother   . Cirrhosis Mother   . Breast cancer Sister 11  . Thyroid disease Sister   . Heart failure Sister        rheumatic fever  . Rheumatic fever Sister    Allergies  Allergen Reactions  . Penicillins Hives  . Adhesive [Tape] Rash    band-aid  . Enoxaparin Rash    ROS    Objective:    BP 122/68   Pulse (!) 58   Temp 98.6 F (37 C)   Ht 4' 11.75 (1.518 m)   Wt 216 lb 4 oz (98.1 kg)   LMP 02/27/2010   SpO2 99%   BMI 42.59 kg/m  BP Readings from Last 3 Encounters:  08/23/24 122/68  06/07/24 138/88  05/02/24 126/84   Wt Readings from Last 3 Encounters:  08/23/24 216 lb 4 oz (98.1 kg)  06/07/24 212 lb 14.4 oz (96.6  kg)  05/02/24 213 lb 6 oz (96.8 kg)    Physical Exam Vitals and nursing note reviewed.  Constitutional:      General: She is not in acute distress.    Appearance: Normal appearance.  HENT:     Head: Normocephalic.     Right Ear: Tympanic membrane, ear canal and external ear normal.     Left Ear: Tympanic membrane, ear canal and external ear normal.  Eyes:     Extraocular Movements: Extraocular movements intact.     Pupils: Pupils are equal, round, and reactive to light.  Cardiovascular:     Rate and Rhythm: Normal rate and regular rhythm.     Heart sounds: Normal heart sounds.  Pulmonary:     Effort: Pulmonary effort is normal.     Breath sounds: Normal breath sounds. No wheezing or rhonchi.  Musculoskeletal:     Right lower leg: No edema.     Left lower leg: No edema.  Neurological:     General: No focal deficit present.     Mental Status: She is alert and oriented to person, place, and time.  Psychiatric:        Mood and Affect: Mood normal.        Behavior: Behavior normal.      Results for orders placed or performed in visit on 08/23/24  CBC with Differential/Platelet  Result Value Ref Range    WBC 8.4 3.8 - 10.8 Thousand/uL   RBC 4.48 3.80 - 5.10 Million/uL   Hemoglobin 14.2 11.7 - 15.5 g/dL   HCT 57.2 64.0 - 53.9 %   MCV 95.3 81.4 - 101.7 fL   MCH 31.7 27.0 - 33.0 pg   MCHC 33.3 31.6 - 35.4 g/dL   RDW 87.4 88.9 - 84.9 %   Platelets 221 140 - 400 Thousand/uL   MPV 12.2 7.5 - 12.5 fL   Neutro Abs 5,838 1,500 - 7,800 cells/uL   Absolute Lymphocytes 1,655 850 - 3,900 cells/uL   Absolute Monocytes 748 200 - 950 cells/uL   Eosinophils Absolute 101 15 - 500 cells/uL   Basophils Absolute 59 0 - 200 cells/uL   Neutrophils Relative % 69.5 %   Total Lymphocyte 19.7 %   Monocytes Relative 8.9 %   Eosinophils Relative 1.2 %   Basophils Relative 0.7 %  Comprehensive metabolic panel with GFR  Result Value Ref Range   Glucose, Bld 90 65 - 99 mg/dL   BUN 15 7 - 25 mg/dL   Creat 9.25 9.49 - 8.94 mg/dL   eGFR 88 > OR = 60 fO/fpw/8.26f7   BUN/Creatinine Ratio SEE NOTE: 6 - 22 (calc)   Sodium 139 135 - 146 mmol/L   Potassium 4.7 3.5 - 5.3 mmol/L   Chloride 105 98 - 110 mmol/L   CO2 22 20 - 32 mmol/L   Calcium  9.7 8.6 - 10.4 mg/dL   Total Protein 7.6 6.1 - 8.1 g/dL   Albumin 4.7 3.6 - 5.1 g/dL   Globulin 2.9 1.9 - 3.7 g/dL (calc)   AG Ratio 1.6 1.0 - 2.5 (calc)   Total Bilirubin 0.3 0.2 - 1.2 mg/dL   Alkaline phosphatase (APISO) 58 37 - 153 U/L   AST 21 10 - 35 U/L   ALT 16 6 - 29 U/L  Lipid panel  Result Value Ref Range   Cholesterol 178 <200 mg/dL   HDL 76 > OR = 50 mg/dL   Triglycerides 79 <849 mg/dL   LDL Cholesterol (Calc) 85 mg/dL (calc)  Total CHOL/HDL Ratio 2.3 <5.0 (calc)   Non-HDL Cholesterol (Calc) 102 <130 mg/dL (calc)

## 2024-08-24 ENCOUNTER — Other Ambulatory Visit: Payer: Self-pay

## 2024-08-24 ENCOUNTER — Ambulatory Visit: Payer: Self-pay | Admitting: Family Medicine

## 2024-08-24 MED ORDER — ROSUVASTATIN CALCIUM 20 MG PO TABS: 20.0000 mg | ORAL_TABLET | Freq: Every day | ORAL | 1 refills | Status: AC

## 2024-08-27 LAB — COMPREHENSIVE METABOLIC PANEL WITH GFR
AG Ratio: 1.6 (calc) (ref 1.0–2.5)
ALT: 16 U/L (ref 6–29)
AST: 21 U/L (ref 10–35)
Albumin: 4.7 g/dL (ref 3.6–5.1)
Alkaline phosphatase (APISO): 58 U/L (ref 37–153)
BUN: 15 mg/dL (ref 7–25)
CO2: 22 mmol/L (ref 20–32)
Calcium: 9.7 mg/dL (ref 8.6–10.4)
Chloride: 105 mmol/L (ref 98–110)
Creat: 0.74 mg/dL (ref 0.50–1.05)
Globulin: 2.9 g/dL (ref 1.9–3.7)
Glucose, Bld: 90 mg/dL (ref 65–99)
Potassium: 4.7 mmol/L (ref 3.5–5.3)
Sodium: 139 mmol/L (ref 135–146)
Total Bilirubin: 0.3 mg/dL (ref 0.2–1.2)
Total Protein: 7.6 g/dL (ref 6.1–8.1)
eGFR: 88 mL/min/1.73m2 (ref >=60)

## 2024-08-27 LAB — CBC WITH DIFFERENTIAL/PLATELET
Absolute Lymphocytes: 1655 {cells}/uL (ref 850–3900)
Absolute Monocytes: 748 {cells}/uL (ref 200–950)
Basophils Absolute: 59 {cells}/uL (ref 0–200)
Basophils Relative: 0.7 %
Eosinophils Absolute: 101 {cells}/uL (ref 15–500)
Eosinophils Relative: 1.2 %
HCT: 42.7 % (ref 35.9–46.0)
Hemoglobin: 14.2 g/dL (ref 11.7–15.5)
MCH: 31.7 pg (ref 27.0–33.0)
MCHC: 33.3 g/dL (ref 31.6–35.4)
MCV: 95.3 fL (ref 81.4–101.7)
MPV: 12.2 fL (ref 7.5–12.5)
Monocytes Relative: 8.9 %
Neutro Abs: 5838 {cells}/uL (ref 1500–7800)
Neutrophils Relative %: 69.5 %
Platelets: 221 Thousand/uL (ref 140–400)
RBC: 4.48 Million/uL (ref 3.80–5.10)
RDW: 12.5 % (ref 11.0–15.0)
Total Lymphocyte: 19.7 %
WBC: 8.4 Thousand/uL (ref 3.8–10.8)

## 2024-08-27 LAB — LIPID PANEL
Cholesterol: 178 mg/dL (ref <200)
HDL: 76 mg/dL (ref >=50)
LDL Cholesterol (Calc): 85 mg/dL
Non-HDL Cholesterol (Calc): 102 mg/dL (ref <130)
Total CHOL/HDL Ratio: 2.3 (calc) (ref <5.0)
Triglycerides: 79 mg/dL (ref <150)

## 2024-08-27 LAB — TSH: TSH: 1.67 m[IU]/L (ref 0.40–4.50)

## 2024-09-08 ENCOUNTER — Ambulatory Visit (HOSPITAL_COMMUNITY): Payer: MEDICARE | Admitting: Licensed Clinical Social Worker

## 2024-09-22 ENCOUNTER — Encounter: Payer: Self-pay | Admitting: Nurse Practitioner

## 2024-09-24 ENCOUNTER — Encounter: Payer: Self-pay | Admitting: Family Medicine

## 2024-09-26 ENCOUNTER — Other Ambulatory Visit: Payer: Self-pay

## 2024-10-04 ENCOUNTER — Ambulatory Visit: Payer: MEDICARE | Admitting: Physician Assistant

## 2024-10-07 ENCOUNTER — Other Ambulatory Visit: Payer: Self-pay | Admitting: Family Medicine

## 2024-10-10 ENCOUNTER — Other Ambulatory Visit: Payer: Self-pay

## 2024-10-19 ENCOUNTER — Other Ambulatory Visit: Payer: Self-pay

## 2024-10-19 MED ORDER — WEGOVY 1.5 MG PO TABS: 1.5000 mg | ORAL_TABLET | Freq: Every day | ORAL | 1 refills | Status: AC

## 2024-10-20 ENCOUNTER — Ambulatory Visit (HOSPITAL_COMMUNITY): Payer: MEDICARE | Admitting: Licensed Clinical Social Worker

## 2024-10-21 ENCOUNTER — Other Ambulatory Visit: Payer: Self-pay | Admitting: Family Medicine

## 2024-10-21 DIAGNOSIS — E785 Hyperlipidemia, unspecified: Secondary | ICD-10-CM

## 2024-10-24 ENCOUNTER — Ambulatory Visit: Payer: MEDICARE

## 2024-11-03 ENCOUNTER — Encounter: Payer: Self-pay | Admitting: Nurse Practitioner

## 2024-11-03 ENCOUNTER — Other Ambulatory Visit: Payer: Self-pay | Admitting: *Deleted

## 2024-11-16 ENCOUNTER — Encounter: Payer: MEDICARE | Admitting: Nurse Practitioner

## 2024-11-21 ENCOUNTER — Ambulatory Visit: Payer: MEDICARE | Attending: General Surgery

## 2024-11-23 ENCOUNTER — Ambulatory Visit: Payer: MEDICARE | Admitting: Family Medicine

## 2024-11-23 ENCOUNTER — Ambulatory Visit: Payer: MEDICARE

## 2024-12-07 ENCOUNTER — Other Ambulatory Visit: Payer: MEDICARE

## 2024-12-07 ENCOUNTER — Inpatient Hospital Stay: Payer: MEDICARE | Admitting: Nurse Practitioner

## 2024-12-07 ENCOUNTER — Inpatient Hospital Stay: Payer: MEDICARE

## 2024-12-07 ENCOUNTER — Ambulatory Visit: Payer: MEDICARE | Admitting: Hematology

## 2025-01-18 ENCOUNTER — Encounter: Payer: MEDICARE | Admitting: Nurse Practitioner
